# Patient Record
Sex: Female | Born: 1970 | Race: White | Hispanic: No | Marital: Single | Smoking: Current every day smoker
Health system: Southern US, Community
[De-identification: ages and names within clinical notes are randomized; demographics above are authoritative.]

## PROBLEM LIST (undated history)

## (undated) DIAGNOSIS — I10 Essential (primary) hypertension: Secondary | ICD-10-CM

## (undated) DIAGNOSIS — K219 Gastro-esophageal reflux disease without esophagitis: Secondary | ICD-10-CM

## (undated) DIAGNOSIS — G4700 Insomnia, unspecified: Secondary | ICD-10-CM

## (undated) DIAGNOSIS — J15211 Pneumonia due to Methicillin susceptible Staphylococcus aureus: Secondary | ICD-10-CM

## (undated) DIAGNOSIS — F329 Major depressive disorder, single episode, unspecified: Secondary | ICD-10-CM

## (undated) DIAGNOSIS — R918 Other nonspecific abnormal finding of lung field: Secondary | ICD-10-CM

## (undated) DIAGNOSIS — J852 Abscess of lung without pneumonia: Secondary | ICD-10-CM

## (undated) DIAGNOSIS — J449 Chronic obstructive pulmonary disease, unspecified: Secondary | ICD-10-CM

## (undated) DIAGNOSIS — K8020 Calculus of gallbladder without cholecystitis without obstruction: Secondary | ICD-10-CM

## (undated) DIAGNOSIS — I82409 Acute embolism and thrombosis of unspecified deep veins of unspecified lower extremity: Secondary | ICD-10-CM

## (undated) HISTORY — DX: Other nonspecific abnormal finding of lung field: R91.8

## (undated) HISTORY — DX: Essential (primary) hypertension: I10

## (undated) HISTORY — DX: Insomnia, unspecified: G47.00

## (undated) HISTORY — DX: Gastro-esophageal reflux disease without esophagitis: K21.9

## (undated) HISTORY — DX: Chronic obstructive pulmonary disease, unspecified: J44.9

## (undated) HISTORY — DX: Abscess of lung without pneumonia: J85.2

## (undated) HISTORY — DX: Calculus of gallbladder without cholecystitis without obstruction: K80.20

## (undated) HISTORY — DX: Acute embolism and thrombosis of unspecified deep veins of unspecified lower extremity: I82.409

## (undated) HISTORY — DX: Pneumonia due to methicillin susceptible Staphylococcus aureus: J15.211

---
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
## 2002-06-30 ENCOUNTER — Inpatient Hospital Stay (HOSPITAL_COMMUNITY): Admission: AD | Admit: 2002-06-30 | Discharge: 2002-07-07 | Payer: Self-pay | Admitting: Obstetrics and Gynecology

## 2002-07-01 ENCOUNTER — Encounter: Payer: Self-pay | Admitting: Obstetrics and Gynecology

## 2003-08-03 ENCOUNTER — Inpatient Hospital Stay (HOSPITAL_COMMUNITY): Admission: EM | Admit: 2003-08-03 | Discharge: 2003-08-07 | Payer: Self-pay | Admitting: Emergency Medicine

## 2003-09-18 ENCOUNTER — Inpatient Hospital Stay (HOSPITAL_COMMUNITY): Admission: EM | Admit: 2003-09-18 | Discharge: 2003-09-22 | Payer: Self-pay | Admitting: Emergency Medicine

## 2003-12-21 ENCOUNTER — Inpatient Hospital Stay (HOSPITAL_COMMUNITY): Admission: RE | Admit: 2003-12-21 | Discharge: 2003-12-23 | Payer: Self-pay | Admitting: Obstetrics and Gynecology

## 2004-01-02 ENCOUNTER — Ambulatory Visit (HOSPITAL_COMMUNITY): Admission: RE | Admit: 2004-01-02 | Discharge: 2004-01-02 | Payer: Self-pay | Admitting: Obstetrics and Gynecology

## 2006-03-02 ENCOUNTER — Emergency Department (HOSPITAL_COMMUNITY): Admission: EM | Admit: 2006-03-02 | Discharge: 2006-03-02 | Payer: Self-pay | Admitting: Emergency Medicine

## 2006-12-23 ENCOUNTER — Inpatient Hospital Stay (HOSPITAL_COMMUNITY): Admission: EM | Admit: 2006-12-23 | Discharge: 2006-12-26 | Payer: Self-pay | Admitting: Emergency Medicine

## 2007-04-09 ENCOUNTER — Emergency Department (HOSPITAL_COMMUNITY): Admission: EM | Admit: 2007-04-09 | Discharge: 2007-04-09 | Payer: Self-pay | Admitting: Emergency Medicine

## 2007-04-19 DIAGNOSIS — J852 Abscess of lung without pneumonia: Secondary | ICD-10-CM

## 2007-04-19 HISTORY — PX: OTHER SURGICAL HISTORY: SHX169

## 2007-04-19 HISTORY — DX: Abscess of lung without pneumonia: J85.2

## 2007-12-12 ENCOUNTER — Inpatient Hospital Stay (HOSPITAL_COMMUNITY): Admission: EM | Admit: 2007-12-12 | Discharge: 2007-12-14 | Payer: Self-pay | Admitting: Emergency Medicine

## 2008-01-06 ENCOUNTER — Inpatient Hospital Stay (HOSPITAL_COMMUNITY): Admission: EM | Admit: 2008-01-06 | Discharge: 2008-01-09 | Payer: Self-pay | Admitting: Emergency Medicine

## 2008-01-11 ENCOUNTER — Ambulatory Visit: Payer: Self-pay | Admitting: Internal Medicine

## 2008-01-11 ENCOUNTER — Inpatient Hospital Stay (HOSPITAL_COMMUNITY): Admission: EM | Admit: 2008-01-11 | Discharge: 2008-01-28 | Payer: Self-pay | Admitting: Emergency Medicine

## 2008-01-11 ENCOUNTER — Ambulatory Visit: Payer: Self-pay | Admitting: Critical Care Medicine

## 2008-01-12 ENCOUNTER — Ambulatory Visit: Payer: Self-pay | Admitting: Internal Medicine

## 2008-01-14 ENCOUNTER — Encounter: Payer: Self-pay | Admitting: Infectious Diseases

## 2008-01-15 ENCOUNTER — Encounter: Payer: Self-pay | Admitting: Pulmonary Disease

## 2008-02-04 ENCOUNTER — Encounter: Payer: Self-pay | Admitting: Vascular Surgery

## 2008-02-04 ENCOUNTER — Encounter: Payer: Self-pay | Admitting: Infectious Diseases

## 2008-02-05 ENCOUNTER — Encounter: Admission: RE | Admit: 2008-02-05 | Discharge: 2008-02-05 | Payer: Self-pay | Admitting: Diagnostic Radiology

## 2008-02-08 ENCOUNTER — Telehealth (INDEPENDENT_AMBULATORY_CARE_PROVIDER_SITE_OTHER): Payer: Self-pay | Admitting: *Deleted

## 2008-02-11 ENCOUNTER — Ambulatory Visit: Payer: Self-pay | Admitting: Emergency Medicine

## 2008-02-11 ENCOUNTER — Encounter: Payer: Self-pay | Admitting: Infectious Diseases

## 2008-02-11 DIAGNOSIS — D649 Anemia, unspecified: Secondary | ICD-10-CM | POA: Insufficient documentation

## 2008-02-11 DIAGNOSIS — J15211 Pneumonia due to Methicillin susceptible Staphylococcus aureus: Secondary | ICD-10-CM

## 2008-02-11 DIAGNOSIS — J45909 Unspecified asthma, uncomplicated: Secondary | ICD-10-CM | POA: Insufficient documentation

## 2008-02-11 DIAGNOSIS — F17200 Nicotine dependence, unspecified, uncomplicated: Secondary | ICD-10-CM | POA: Insufficient documentation

## 2008-02-11 HISTORY — DX: Unspecified asthma, uncomplicated: J45.909

## 2008-02-11 HISTORY — DX: Pneumonia due to methicillin susceptible Staphylococcus aureus: J15.211

## 2008-02-12 ENCOUNTER — Telehealth: Payer: Self-pay | Admitting: Infectious Diseases

## 2008-02-12 ENCOUNTER — Ambulatory Visit: Payer: Self-pay | Admitting: Infectious Diseases

## 2008-02-18 ENCOUNTER — Encounter: Payer: Self-pay | Admitting: Vascular Surgery

## 2008-02-18 ENCOUNTER — Encounter: Payer: Self-pay | Admitting: Infectious Diseases

## 2008-02-20 ENCOUNTER — Telehealth (INDEPENDENT_AMBULATORY_CARE_PROVIDER_SITE_OTHER): Payer: Self-pay | Admitting: *Deleted

## 2008-02-22 ENCOUNTER — Encounter: Payer: Self-pay | Admitting: Infectious Diseases

## 2008-02-27 ENCOUNTER — Telehealth (INDEPENDENT_AMBULATORY_CARE_PROVIDER_SITE_OTHER): Payer: Self-pay | Admitting: *Deleted

## 2008-02-28 ENCOUNTER — Ambulatory Visit: Payer: Self-pay | Admitting: Infectious Diseases

## 2008-03-05 ENCOUNTER — Ambulatory Visit: Payer: Self-pay | Admitting: Emergency Medicine

## 2008-03-07 ENCOUNTER — Telehealth (INDEPENDENT_AMBULATORY_CARE_PROVIDER_SITE_OTHER): Payer: Self-pay | Admitting: *Deleted

## 2008-03-10 ENCOUNTER — Ambulatory Visit: Payer: Self-pay | Admitting: Emergency Medicine

## 2008-05-20 ENCOUNTER — Emergency Department (HOSPITAL_COMMUNITY): Admission: EM | Admit: 2008-05-20 | Discharge: 2008-05-20 | Payer: Self-pay | Admitting: Emergency Medicine

## 2008-06-05 ENCOUNTER — Ambulatory Visit: Payer: Self-pay | Admitting: Emergency Medicine

## 2008-06-10 ENCOUNTER — Ambulatory Visit: Payer: Self-pay | Admitting: Emergency Medicine

## 2008-06-10 ENCOUNTER — Telehealth: Payer: Self-pay | Admitting: Emergency Medicine

## 2008-06-10 ENCOUNTER — Ambulatory Visit: Payer: Self-pay | Admitting: Cardiovascular Disease

## 2008-06-17 ENCOUNTER — Telehealth (INDEPENDENT_AMBULATORY_CARE_PROVIDER_SITE_OTHER): Payer: Self-pay | Admitting: *Deleted

## 2008-06-17 ENCOUNTER — Ambulatory Visit: Payer: Self-pay | Admitting: Emergency Medicine

## 2008-06-17 DIAGNOSIS — J449 Chronic obstructive pulmonary disease, unspecified: Secondary | ICD-10-CM | POA: Insufficient documentation

## 2008-06-18 ENCOUNTER — Telehealth: Payer: Self-pay | Admitting: Emergency Medicine

## 2008-07-01 ENCOUNTER — Telehealth: Payer: Self-pay | Admitting: Emergency Medicine

## 2008-07-22 ENCOUNTER — Ambulatory Visit: Payer: Self-pay | Admitting: Emergency Medicine

## 2008-07-22 DIAGNOSIS — J309 Allergic rhinitis, unspecified: Secondary | ICD-10-CM | POA: Insufficient documentation

## 2008-07-31 ENCOUNTER — Emergency Department (HOSPITAL_COMMUNITY): Admission: EM | Admit: 2008-07-31 | Discharge: 2008-07-31 | Payer: Self-pay | Admitting: Emergency Medicine

## 2008-08-01 ENCOUNTER — Ambulatory Visit: Payer: Self-pay | Admitting: Emergency Medicine

## 2008-08-01 ENCOUNTER — Telehealth: Payer: Self-pay | Admitting: Emergency Medicine

## 2008-08-01 DIAGNOSIS — J209 Acute bronchitis, unspecified: Secondary | ICD-10-CM | POA: Insufficient documentation

## 2008-09-08 ENCOUNTER — Ambulatory Visit: Payer: Self-pay | Admitting: Emergency Medicine

## 2008-09-10 ENCOUNTER — Telehealth (INDEPENDENT_AMBULATORY_CARE_PROVIDER_SITE_OTHER): Payer: Self-pay | Admitting: *Deleted

## 2008-09-10 ENCOUNTER — Emergency Department (HOSPITAL_COMMUNITY): Admission: EM | Admit: 2008-09-10 | Discharge: 2008-09-10 | Payer: Self-pay | Admitting: Emergency Medicine

## 2008-09-12 ENCOUNTER — Telehealth (INDEPENDENT_AMBULATORY_CARE_PROVIDER_SITE_OTHER): Payer: Self-pay | Admitting: *Deleted

## 2008-09-18 ENCOUNTER — Telehealth: Payer: Self-pay | Admitting: Emergency Medicine

## 2008-10-13 ENCOUNTER — Ambulatory Visit: Payer: Self-pay | Admitting: Emergency Medicine

## 2008-10-21 ENCOUNTER — Encounter: Payer: Self-pay | Admitting: Emergency Medicine

## 2008-12-12 ENCOUNTER — Emergency Department (HOSPITAL_COMMUNITY): Admission: EM | Admit: 2008-12-12 | Discharge: 2008-12-12 | Payer: Self-pay | Admitting: Emergency Medicine

## 2008-12-12 ENCOUNTER — Inpatient Hospital Stay (HOSPITAL_COMMUNITY): Admission: RE | Admit: 2008-12-12 | Discharge: 2008-12-15 | Payer: Self-pay | Admitting: Emergency Medicine

## 2008-12-15 ENCOUNTER — Telehealth: Payer: Self-pay | Admitting: Emergency Medicine

## 2008-12-18 ENCOUNTER — Telehealth: Payer: Self-pay | Admitting: Emergency Medicine

## 2008-12-18 DIAGNOSIS — I82409 Acute embolism and thrombosis of unspecified deep veins of unspecified lower extremity: Secondary | ICD-10-CM | POA: Insufficient documentation

## 2008-12-18 HISTORY — DX: Acute embolism and thrombosis of unspecified deep veins of unspecified lower extremity: I82.409

## 2008-12-19 ENCOUNTER — Telehealth: Payer: Self-pay | Admitting: Emergency Medicine

## 2008-12-19 ENCOUNTER — Encounter: Payer: Self-pay | Admitting: Internal Medicine

## 2008-12-23 ENCOUNTER — Encounter: Payer: Self-pay | Admitting: Cardiology

## 2008-12-23 LAB — CONVERTED CEMR LAB: POC INR: 2.2

## 2008-12-24 ENCOUNTER — Ambulatory Visit: Payer: Self-pay | Admitting: Cardiology

## 2008-12-24 LAB — CONVERTED CEMR LAB: POC INR: 2.1

## 2008-12-25 ENCOUNTER — Encounter: Payer: Self-pay | Admitting: Emergency Medicine

## 2008-12-31 ENCOUNTER — Ambulatory Visit: Payer: Self-pay | Admitting: Internal Medicine

## 2008-12-31 LAB — CONVERTED CEMR LAB: POC INR: 2.8

## 2009-01-06 ENCOUNTER — Ambulatory Visit: Payer: Self-pay | Admitting: Emergency Medicine

## 2009-01-10 ENCOUNTER — Emergency Department (HOSPITAL_COMMUNITY): Admission: EM | Admit: 2009-01-10 | Discharge: 2009-01-10 | Payer: Self-pay | Admitting: Emergency Medicine

## 2009-01-12 ENCOUNTER — Telehealth (INDEPENDENT_AMBULATORY_CARE_PROVIDER_SITE_OTHER): Payer: Self-pay | Admitting: *Deleted

## 2009-01-14 ENCOUNTER — Ambulatory Visit: Payer: Self-pay | Admitting: Cardiovascular Disease

## 2009-01-14 LAB — CONVERTED CEMR LAB: POC INR: 2.9

## 2009-01-19 ENCOUNTER — Ambulatory Visit: Payer: Self-pay | Admitting: Cardiology

## 2009-01-19 LAB — CONVERTED CEMR LAB: POC INR: 3.3

## 2009-01-22 ENCOUNTER — Encounter: Payer: Self-pay | Admitting: Emergency Medicine

## 2009-01-28 ENCOUNTER — Ambulatory Visit: Payer: Self-pay | Admitting: Cardiology

## 2009-01-28 LAB — CONVERTED CEMR LAB: POC INR: 2

## 2009-02-11 ENCOUNTER — Ambulatory Visit: Payer: Self-pay | Admitting: Cardiology

## 2009-02-12 ENCOUNTER — Ambulatory Visit: Payer: Self-pay | Admitting: Emergency Medicine

## 2009-02-18 ENCOUNTER — Telehealth (INDEPENDENT_AMBULATORY_CARE_PROVIDER_SITE_OTHER): Payer: Self-pay | Admitting: *Deleted

## 2009-02-18 ENCOUNTER — Ambulatory Visit: Payer: Self-pay | Admitting: Cardiology

## 2009-02-19 ENCOUNTER — Telehealth (INDEPENDENT_AMBULATORY_CARE_PROVIDER_SITE_OTHER): Payer: Self-pay | Admitting: *Deleted

## 2009-02-19 ENCOUNTER — Emergency Department (HOSPITAL_COMMUNITY): Admission: EM | Admit: 2009-02-19 | Discharge: 2009-02-19 | Payer: Self-pay | Admitting: Emergency Medicine

## 2009-02-23 ENCOUNTER — Telehealth: Payer: Self-pay | Admitting: Emergency Medicine

## 2009-02-26 ENCOUNTER — Ambulatory Visit: Payer: Self-pay | Admitting: Cardiology

## 2009-03-04 ENCOUNTER — Ambulatory Visit: Payer: Self-pay | Admitting: Cardiology

## 2009-03-16 ENCOUNTER — Ambulatory Visit: Payer: Self-pay | Admitting: Emergency Medicine

## 2009-03-25 ENCOUNTER — Ambulatory Visit: Payer: Self-pay | Admitting: Cardiovascular Disease

## 2009-04-08 ENCOUNTER — Ambulatory Visit: Payer: Self-pay | Admitting: Cardiology

## 2009-04-08 LAB — CONVERTED CEMR LAB: POC INR: 2.7

## 2009-04-30 ENCOUNTER — Ambulatory Visit: Payer: Self-pay | Admitting: Cardiovascular Disease

## 2009-04-30 LAB — CONVERTED CEMR LAB: POC INR: 1.9

## 2009-05-21 ENCOUNTER — Telehealth (INDEPENDENT_AMBULATORY_CARE_PROVIDER_SITE_OTHER): Payer: Self-pay | Admitting: *Deleted

## 2009-05-25 ENCOUNTER — Telehealth: Payer: Self-pay | Admitting: Emergency Medicine

## 2009-05-27 ENCOUNTER — Telehealth: Payer: Self-pay | Admitting: Emergency Medicine

## 2009-05-28 ENCOUNTER — Ambulatory Visit: Payer: Self-pay | Admitting: Cardiology

## 2009-05-28 LAB — CONVERTED CEMR LAB: POC INR: 2.9

## 2009-05-29 ENCOUNTER — Telehealth (INDEPENDENT_AMBULATORY_CARE_PROVIDER_SITE_OTHER): Payer: Self-pay | Admitting: *Deleted

## 2009-05-29 ENCOUNTER — Ambulatory Visit: Payer: Self-pay | Admitting: Emergency Medicine

## 2009-05-30 ENCOUNTER — Inpatient Hospital Stay (HOSPITAL_COMMUNITY)
Admission: EM | Admit: 2009-05-30 | Discharge: 2009-05-31 | Disposition: A | Discharge status: Home / Self Care | Payer: Self-pay | Admitting: Emergency Medicine

## 2009-06-01 ENCOUNTER — Ambulatory Visit: Payer: Self-pay | Admitting: Cardiology

## 2009-06-01 LAB — CONVERTED CEMR LAB: POC INR: 2.8

## 2009-06-04 ENCOUNTER — Ambulatory Visit: Payer: Self-pay | Admitting: Cardiology

## 2009-06-05 ENCOUNTER — Ambulatory Visit: Payer: Self-pay | Admitting: Emergency Medicine

## 2009-06-05 ENCOUNTER — Encounter: Payer: Self-pay | Admitting: Cardiology

## 2009-07-07 ENCOUNTER — Ambulatory Visit: Payer: Self-pay | Admitting: Emergency Medicine

## 2009-07-07 DIAGNOSIS — R002 Palpitations: Secondary | ICD-10-CM | POA: Insufficient documentation

## 2009-07-07 LAB — CONVERTED CEMR LAB
Protein C Activity: 156 % — ABNORMAL HIGH (ref 75–133)
Protein S Activity: 113 % (ref 69–129)

## 2009-07-24 ENCOUNTER — Encounter (INDEPENDENT_AMBULATORY_CARE_PROVIDER_SITE_OTHER): Payer: Self-pay | Admitting: *Deleted

## 2009-09-23 ENCOUNTER — Encounter (INDEPENDENT_AMBULATORY_CARE_PROVIDER_SITE_OTHER): Payer: Self-pay | Admitting: *Deleted

## 2009-10-05 ENCOUNTER — Ambulatory Visit: Payer: Self-pay | Admitting: Emergency Medicine

## 2009-11-09 ENCOUNTER — Telehealth (INDEPENDENT_AMBULATORY_CARE_PROVIDER_SITE_OTHER): Payer: Self-pay | Admitting: *Deleted

## 2009-11-23 ENCOUNTER — Encounter: Payer: Self-pay | Admitting: Emergency Medicine

## 2009-12-28 ENCOUNTER — Telehealth (INDEPENDENT_AMBULATORY_CARE_PROVIDER_SITE_OTHER): Payer: Self-pay | Admitting: *Deleted

## 2010-01-27 ENCOUNTER — Telehealth: Payer: Self-pay | Admitting: Emergency Medicine

## 2010-02-08 ENCOUNTER — Telehealth: Payer: Self-pay | Admitting: Emergency Medicine

## 2010-03-19 ENCOUNTER — Ambulatory Visit: Payer: Self-pay | Admitting: Emergency Medicine

## 2010-05-06 ENCOUNTER — Telehealth (INDEPENDENT_AMBULATORY_CARE_PROVIDER_SITE_OTHER): Payer: Self-pay | Admitting: *Deleted

## 2010-05-09 ENCOUNTER — Encounter: Payer: Self-pay | Admitting: Pulmonary Disease

## 2010-05-09 ENCOUNTER — Encounter: Payer: Self-pay | Admitting: Emergency Medicine

## 2010-05-12 ENCOUNTER — Telehealth (INDEPENDENT_AMBULATORY_CARE_PROVIDER_SITE_OTHER): Payer: Self-pay | Admitting: *Deleted

## 2010-05-18 NOTE — Medication Information (Signed)
Summary: Coumadin Clinic  Anticoagulant Therapy  Managed by: Inactive Referring MD: Levy Pupa MD PCP: Merwyn Katos MD Supervising MD: Diona Browner MD,Samuel Indication 1: DVT Lab Used: [facility] [place] Site: [place] INR RANGE 2.0-3.0          Comments: Received call from Dr Kavin Leech office today that he is taking pt off coumadin.  She is greater than 42 months post DVT.  Allergies: 1)  ! Penicillin 2)  ! Ceclor  Anticoagulation Management History:      Negative risk factors for bleeding include an age less than 42 years old.  The bleeding index is 'low risk'.  Negative CHADS2 values include Age > 42 years old.  Anticoagulation responsible provider: Diona Browner MD,Samuel.  Exp: 02/2010.    Anticoagulation Management Assessment/Plan:      The patient's current anticoagulation dose is Coumadin 10 mg tabs: take one tablet daily as directed by coumadin clinic.  The target INR is 2.0-3.0.  The next INR is due 06/10/2009.  Anticoagulation instructions were given to patient.  Results were reviewed/authorized by Inactive.         Prior Anticoagulation Instructions: INR 5.1 Hold coumadin tonight and tomorrow night, take 1/2 tablet on Saturday and Sunday then resume 1 tablet once daily except 1 1/2 tablets on Mondays, Wednesdays and Fridays

## 2010-05-18 NOTE — Assessment & Plan Note (Signed)
Summary: COPD, hx DVT, palpitations   Visit Type:  Follow-up Primary Provider/Referring Provider:  Health Department  CC:  1 month followup COPD.  Pt still c/o prod cough with yellow/brown sputum.  She states that her breathing is the same- no better or worse.  She is still smoking about 1/2 ppd.  Has not determined a quit date yet.[name]  History of Present Illness: 40 yo smoker who was admitted to [facility] 9/25 - 10/12 for bilateral MSSA cavitary PNA's c/b L empyema, VDRF. She underwent L CT placement and then a second anterior pigtail to drain residual hydropneumothorax. She went home on IV Ancef and finished 6 weeks of therapy. Pigtail was d/c'd on 02/05/08, had CT chest that same day that showed resolution of L hydroPTX, some inferior residual effusion, bilateral parenchymal disease, better than during hospitalization (10/6). Dx with LE DVT in   ROV 03/16/09 -- Since last visit, she has stopped cigarettes! Stopped on Nov 5. Was taking wellbutrin, then stopped it and started on chantix (? dose) two times a day. Also since last visit, started on Symbicort 80 two times a day. She feels better altogether since last visit, more energy.   ROV 05/29/09 -- Acute visit. I treated her for an apparent exacerbation with Pred and doxy by phone 2/7. Here today to report chest tightness, fatigue, poor by mouth intake, fevers, some chills. Coughing greenish phlegm. Off chantix. Restarted cigarettes 3 weeks ago, 5cig a day. Doesn't feel the Pred has helped her, if anything her breathing is tighter. On 40mg  by mouth once daily currently  ROV 07/07/09 -- Returns for f/u. Off prednisone. Tells me that she is about the same. Her breathing is still limited with exertion. She has noticed some HA's. Also having some periods of heart fluttering associated with some lightheadedness. She doesn't necessarily relate these to her bronchodilator. Wheezing and cough are still problems - unchanged. Still smoking 1/2 pk a day.   Current  Medications (verified): 1)  Ventolin Hfa 108 (90 Base) Mcg/act Aers (Albuterol Sulfate) .... Inhale 2 Puffs Every 4 Hours As Needed 2)  Spiriva Handihaler 18 Mcg Caps (Tiotropium Bromide Monohydrate) .[name].. 1 Inhaled Once Daily 3)  Albuterol Sulfate (2.5 Mg/53ml) 0.083%  [place] (Albuterol Sulfate) .[name].. 1 Vial Via Hhn Every 4-6hrs As Needed For Shortness of Breath 4)  Symbicort 160-4.5 Mcg/act Aero (Budesonide-Formoterol Fumarate) .... 2 Puffs Two Times A Day  Allergies (verified): 1)  ! Penicillin 2)  ! 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  mildly anxious appearing Head:  normocephalic and atraumatic Eyes:  conjunctiva and sclera clear Nose:  clear drainage Mouth:  post pharynx with erythema Neck:  supple.   Chest Wall:  well-healed CT and pigtail cath sites Lungs:  distant but no wheze (improved) Heart:  normal rate, regular rhythm, and no murmur.   Abdomen:  not examined Msk:  no deformity or scoliosis noted with normal posture Extremities:  no  clubbing, cyanosis, edema, or deformity noted Neurologic:  non-focal Skin:  no rashes.   Psych:  alert and cooperative; normal mood and affect; normal attention span and concentration   Impression & Recommendations:  Problem # 1:  DVT (ICD-453.40) - hypercoag w/u; she has been off coumadin for a month Orders: T- * Misc. Laboratory test [phone]) T- * Misc. Laboratory test [phone]) Est. Patient Level IV (29562)  Problem # 2:  COPD (ICD-496) - same BDs - discussed tobacco cessation.  Problem # 3:  PALPITATIONS  (ICD-785.1)  I'm not able to correlate these with her bronchodilator use. ? whether she needs event monitor.  - will refer to cardiology in [place] to consider w/u  Orders: Est. Patient Level IV (13086) Cardiology Referral (Cardiology)  Patient Instructions: 1)  We will check your blood work today to look for increased risk for blood clots. You will review these results with Dr Delton Coombes next visit 2)  Continue your inhaled medications as you are taking them.  3)  We will refer you to Cardiology in [place] to evaluate your palpitations.  4)  Follow up with Dr Delton Coombes in 3 months or as needed  5)  Call our office if your are ready to set a quit date for your cigarettes.

## 2010-05-18 NOTE — Assessment & Plan Note (Signed)
Summary: COPD   Visit Type:  Follow-up Primary Provider/Referring Provider:  Health Department  CC:  3 mo DVT and COPD follow-up.  The patient c/o increased sneezing x2-3 days and cough with clear mucus. She has not been using the Spiriva or Symbicort or nebulizer x1 and 1/2 months. She is only using the Ventolin.Marland Kitchen  History of Present Illness: 40 yo smoker who was admitted to [facility] 9/25 - 10/12 for bilateral MSSA cavitary PNA's c/b L empyema, VDRF. She underwent L CT placement and then a second anterior pigtail to drain residual hydropneumothorax. She went home on IV Ancef and finished 6 weeks of therapy. Pigtail was d/c'd on 02/05/08, had CT chest that same day that showed resolution of L hydroPTX, some inferior residual effusion, bilateral parenchymal disease, better than during hospitalization (10/6). Dx with LE DVT, now off anticoag  ROV 03/16/09 -- Since last visit, she has stopped cigarettes! Stopped on Nov 5. Was taking wellbutrin, then stopped it and started on chantix (? dose) two times a day. Also since last visit, started on Symbicort 80 two times a day. She feels better altogether since last visit, more energy.   ROV 05/29/09 -- Acute visit. I treated her for an apparent exacerbation with Pred and doxy by phone 2/7. Here today to report chest tightness, fatigue, poor by mouth intake, fevers, some chills. Coughing greenish phlegm. Off chantix. Restarted cigarettes 3 weeks ago, 5cig a day. Doesn't feel the Pred has helped her, if anything her breathing is tighter. On 40mg  by mouth once daily currently  ROV 07/07/09 -- Returns for f/u. Off prednisone. Tells me that she is about the same. Her breathing is still limited with exertion. She has noticed some HA's. Also having some periods of heart fluttering associated with some lightheadedness. She doesn't necessarily relate these to her bronchodilator. Wheezing and cough are still problems - unchanged. Still smoking 1/2 pk a day.   ROV 10/05/09 --  regular f/u COPD. Since last visit has had high stress, has lost some wt. Her EtOH use and her cigarette use have both gone up, now 2pks/day. Stopped Spiriva and Symbicort 1 month ago. Her breathing worsened slightly, but she feels she has tolerated being off. Her Ventolin use has increased some. No exacerbations since last visit.   Preventive Screening-Counseling & Management  Alcohol-Tobacco     Smoking Cessation Counseling: yes     Packs/Day: 2.0  Current Medications (verified): 1)  Ventolin Hfa 108 (90 Base) Mcg/act Aers (Albuterol Sulfate) .... Inhale 2 Puffs Every 4 Hours As Needed  Allergies (verified): 1)  ! Penicillin 2)  ! Ceclor  Social History: Patient states former smoker. Smoking x 61yrs upto 2ppd. Pt is seperated with 6 children. Just broke up with her boyfriend Pt is homemaker. Increased EtOH use to 4 -5 beers/dayPacks/Day:  2.0  Vital Signs:  Patient profile:   40 year old female Height:      64 inches (162.56 cm) Weight:      154 pounds (70 kg) BMI:     26.53 O2 Sat:      94 % on Room air Temp:     98.1 degrees F (36.72 degrees C) oral Pulse rate:   105 / minute BP sitting:   118 / 72  (right arm) Cuff size:   regular  Vitals Entered By: Michel Bickers CMA (October 05, 2009 1:34 PM)  O2 Sat at Rest %:  94 O2 Flow:  Room air CC: 3 mo DVT and COPD follow-up.  The patient c/o increased sneezing x2-3 days and cough with clear mucus. She has not been using the Spiriva or Symbicort or nebulizer x1 and 1/2 months. She is only using the Ventolin. Comments Medications reviewed. Daytime phone verified. Michel Bickers CMA  October 05, 2009 1:35 PM   Physical Exam  General:  alert and well-developed.  mildly anxious appearing, has lost some wt Head:  normocephalic and atraumatic Eyes:  conjunctiva and sclera clear Nose:  clear drainage Mouth:  post pharynx with erythema Neck:  supple.   Chest Wall:  well-healed CT and pigtail cath sites Lungs:  distant, scattered exp  wheezes Heart:  normal rate, regular rhythm, and no murmur.   Abdomen:  not examined Msk:  no deformity or scoliosis noted with normal posture Extremities:  no clubbing, cyanosis, edema, or deformity noted Neurologic:  non-focal Skin:  no rashes.   Psych:  alert and cooperative; normal mood and affect; normal attention span and concentration   Impression & Recommendations:  Problem # 1:  COPD (ICD-496) Compliance and smoking have both worsened significantly since last visit, although she denies breathing changes.   restart spiriva + ventolin hold off on restart symbicort and follow clinical status ROV 4 months  Problem # 2:  TOBACCO ABUSE (ICD-305.1) counselled cessation  Medications Added to Medication List This Visit: 1)  Spiriva Handihaler 18 Mcg Caps (Tiotropium bromide monohydrate) .Marland Kitchen.. 1 inhalation once daily  Other Orders: Prescription Created Electronically [id number]) Est. Patient Level IV (02725) Tobacco use cessation intermediate 3-10 minutes (36644)  Patient Instructions: 1)  Restart Spiriva 1 once daily  2)  Use ventolin as needed 3)  Follow up with Dr Delton Coombes in 4 months or as needed  Prescriptions: SPIRIVA HANDIHALER 18 MCG CAPS (TIOTROPIUM BROMIDE MONOHYDRATE) 1 inhalation once daily  #30 x 11   Entered and Authorized by:   Leslye Peer MD   Signed by:   Leslye Peer MD on 10/05/2009   Method used:   Electronically to        [facility] (retail)       [address]       [place], [place]  [address]       Ph: [phone]       Fax: [phone]   RxID:   [id number] VENTOLIN HFA 108 (90 BASE) MCG/ACT AERS (ALBUTEROL SULFATE) Inhale 2 puffs every 4 hours as needed  #1 x 6   Entered and Authorized by:   Leslye Peer MD   Signed by:   Leslye Peer MD on 10/05/2009   Method used:   Electronically to        [facility] (retail)       [address]       [place], [place]  [address]        Ph: [phone]       Fax: [phone]   RxID:   [id number]

## 2010-05-18 NOTE — Progress Notes (Signed)
Summary: fever and tightness in chest  Phone Note Call from Patient   Caller: Patient Call For: byrum Summary of Call: pt not any betterstill have tightness in the chest fever100.4 and no appetite was told to call back if no better. Initial call taken by: Rickard Patience,  May 29, 2009 8:23 AM  Follow-up for Phone Call        OV with RB this am at 11. Follow-up by: Vernie Murders,  May 29, 2009 9:19 AM

## 2010-05-18 NOTE — Progress Notes (Signed)
Summary: prescript fro symbicort  Phone Note Call from Patient Call back at [phone]   Caller: Patient Call For: Eithan Beagle Summary of Call: need prescript for symbicort called to pharmacy [facility] [phone] Initial call taken by: Rickard Patience,  January 27, 2010 2:38 PM  Follow-up for Phone Call        LMTCBx1. Pt has appt tomorrow?  Carron Curie CMA  January 27, 2010 3:49 PM Spoke with pt and symbicort is not on her med list. She staets that at last OV she told RB that she didnt need symbicort but she has been having increased wheezing and SOB and feels she needs to go back on Symbicort along with the Spiriva.  [place] for appt so she wants to reschedule. I advised we can reschedule but she has to see him at least once a year for refills. Pt states understanding. Please advsie if ok to send in refill.  Carron Curie CMA  January 27, 2010 4:06 PM   Additional Follow-up for Phone Call Additional follow up Details #1::        pt still have not heard whether she will be able to have symbicort. Additional Follow-up by: Rickard Patience,  January 28, 2010 9:06 AM    Additional Follow-up for Phone Call Additional follow up Details #2::    It it OK to send in a refill for Symbicort 160/4.5 2 puffs two times a day. But I do want to see her soon. Alternatively [place] Follow-up by: Leslye Peer MD,  January 28, 2010 10:21 AM  Additional Follow-up for Phone Call Additional follow up Details #3:: Details for Additional Follow-up Action Taken: refill sent. pt aware needs to keep appt. pt states she wants to continue to see RB. Carron Curie CMA  January 28, 2010 10:54 AM   New/Updated Medications: SYMBICORT 160-4.5 MCG/ACT AERO (BUDESONIDE-FORMOTEROL FUMARATE) 2 puffs twice daily Prescriptions: SYMBICORT 160-4.5 MCG/ACT AERO (BUDESONIDE-FORMOTEROL FUMARATE) 2 puffs twice  daily  #1 x 0   Entered by:   Carron Curie CMA   Authorized by:   Leslye Peer MD   Signed by:   Carron Curie CMA on 01/28/2010   Method used:   Electronically to        [facility] [address] [address] (retail)       [address]       [place], [place]  [address]       Ph: [phone]       Fax: [phone]   RxID:   [id number]

## 2010-05-18 NOTE — Progress Notes (Signed)
Summary: nos appt  Phone Note Call from Patient   Caller: [email or web address]  Call For: byrum Summary of Call: LMTCB x2 to rsc nos from 10/21. Initial call taken by: Darletta Moll,  February 08, 2010 3:06 PM

## 2010-05-18 NOTE — Medication Information (Signed)
Summary: ccr-lr  Anticoagulant Therapy  Managed by: Vashti Hey, RN Referring MD: Levy Pupa MD PCP: Merwyn Katos MD Supervising MD: Diona Browner MD, Remi Deter Indication 1: DVT Lab Used: [facility] [place] Site: [place] INR POC 2.9 INR RANGE 2.0-3.0  Dietary changes: no    Health status changes: no    Bleeding/hemorrhagic complications: no    Recent/future hospitalizations: no    Any changes in medication regimen? yes       Details: on doxycycline and prednisone  Started Tuesday 2/8  Recent/future dental: no  Any missed doses?: no       Is patient compliant with meds? yes       Allergies: 1)  ! Penicillin 2)  ! Ceclor  Anticoagulation Management History:      Negative risk factors for bleeding include an age less than 35 years old.  The bleeding index is 'low risk'.  Negative CHADS2 values include Age > 47 years old.  Anticoagulation responsible provider: Diona Browner MD, Remi Deter.  INR POC: 2.9.  Exp: 02/2010.    Anticoagulation Management Assessment/Plan:      The patient's current anticoagulation dose is Coumadin 10 mg tabs: take one tablet daily as directed by coumadin clinic.  The target INR is 2.0-3.0.  The next INR is due 06/01/2009.  Anticoagulation instructions were given to patient.  Results were reviewed/authorized by Vashti Hey, RN.  She was notified by Vashti Hey RN.         Prior Anticoagulation Instructions: INR 1.9 Take coumadin 2 tablets tonight then resume 1 tablet once daily except 1 1/2 tablets on Mondays, Wednesdays and Fridays  Current Anticoagulation Instructions: INR 2.9 Continue coumadin 10mg  once daily except 15mg  on Mondays, Wednesdays and Fridays

## 2010-05-18 NOTE — Assessment & Plan Note (Signed)
Summary: COPD, tobacco   Visit Type:  Follow-up Primary Provider/Referring Provider:  Health Department  CC:  4 month followup COPD.  She is c/o left side rib pain x 2 wks- pain radiates to her back and is worse with deep inspiration.  She states that her SOB has been worse over the past 3-4 months.  She also c/o increased cough- prod with yellow sputum x 1 wk.  Still smoking 1 ppd..  History of Present Illness: 39 yo smoker who was admitted to [facility] 9/25 - 10/12 for bilateral MSSA cavitary PNA's c/b L empyema, VDRF. She underwent L CT placement and then a second anterior pigtail to drain residual hydropneumothorax. She went home on IV Ancef and finished 6 weeks of therapy. Pigtail was d/c'd on 02/05/08. Followed now for COPD, tobacco use.   ROV 05/29/09 -- Acute visit. I treated her for an apparent exacerbation with Pred and doxy by phone 2/7. Here today to report chest tightness, fatigue, poor by mouth intake, fevers, some chills. Coughing greenish phlegm. Off chantix. Restarted cigarettes 3 weeks ago, 5cig a day. Doesn't feel the Pred has helped her, if anything her breathing is tighter. On 40mg  by mouth once daily currently  ROV 07/07/09 -- Returns for f/u. Off prednisone. Tells me that she is about the same. Her breathing is still limited with exertion. She has noticed some HA's. Also having some periods of heart fluttering associated with some lightheadedness. She doesn't necessarily relate these to her bronchodilator. Wheezing and cough are still problems - unchanged. Still smoking 1/2 pk a day.   ROV 10/05/09 -- regular f/u COPD. Since last visit has had high stress, has lost some wt. Her EtOH use and her cigarette use have both gone up, now 2pks/day. Stopped Spiriva and Symbicort 1 month ago. Her breathing worsened slightly, but she feels she has tolerated being off. Her Ventolin use has increased some. No exacerbations since last visit.   ROV 03/19/10 -- COPD, tobacco use. Still smoking 1  pk/day. Having SOB with both exertion and sometimes at rest. No fevers, coughs frequently, yellow mucous, no hemoptysis. Describes a pain in L chest below L breast, has moved to flank and L back, notices with deep breath.   Preventive Screening-Counseling & Management  Alcohol-Tobacco     Smoking Cessation Counseling: yes  Current Medications (verified): 1)  Ventolin Hfa 108 (90 Base) Mcg/act Aers (Albuterol Sulfate) .... Inhale 2 Puffs Every 4 Hours As Needed 2)  Spiriva Handihaler 18 Mcg Caps (Tiotropium Bromide Monohydrate) .Marland Kitchen.. 1 Inhalation Once Daily 3)  Symbicort 160-4.5 Mcg/act Aero (Budesonide-Formoterol Fumarate) .... 2 Puffs Twice Daily  Allergies (verified): 1)  ! Penicillin 2)  ! 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 c-section x 2 last 2005  Family History: Reviewed history from 02/11/2008 and no changes required. mother-heart disease father-colon CA, throat CA  Social History: Patient states former smoker. Smoking x 22yrs upto 2ppd. Pt is separated with 6 children. Pt is homemaker, in process of getting disability Increased EtOH use to 4 -5 beers/day  Vital Signs:  Patient profile:   40 year old female Weight:      141.50 pounds O2 Sat:      95 % on Room air Temp:     98.0  degrees F oral Pulse rate:   100 / minute BP sitting:   100 / 70  (left arm)  Vitals Entered By: Vernie Murders (March 19, 2010 2:46 PM)  O2 Flow:  Room air  Physical Exam  General:  alert and well-developed.  mildly anxious appearing, has lost some wt Head:  normocephalic and atraumatic Eyes:  conjunctiva and sclera clear Nose:  no drainage Mouth:  post pharynx with erythema Neck:  supple.     Chest Wall:  well-healed CT and pigtail cath sites Lungs:  B basilar low-pitched wheezes Heart:  normal rate, regular rhythm, and no murmur.   Abdomen:  not examined Msk:  no deformity or scoliosis noted with normal posture Extremities:  no clubbing, cyanosis, edema, or deformity noted Neurologic:  non-focal Skin:  no rashes.   Psych:  alert and cooperative; normal mood and affect; normal attention span and concentration   Impression & Recommendations:  Problem # 1:  COPD (ICD-496) Moderate to severe by PFT and her functional assessment. I have underscored with her that there is not much for me to add to her regimen - the only next thing to do is stop smoking.  - Spiriva + Symbicort - as needed albuterol - follow up 4 months or as needed  - call us when she wants help w smoking.  - flu shot today  Problem # 2:  TOBACCO ABUSE (ICD-305.1)  Discussed reasons to stop in detail. She wants to quit, is hoping to do so at [facility]. I offered her support with the process  Orders: Est. Patient Level IV (24401)  Patient Instructions: 1)  Continue your Spiriva and Symbicort as you are taking them 2)  Use albuterol as needed  3)  Flu shot today 4)  We discussed smoking in detail today. You MUST quit if you want your breathing to stabilize. Your disease will continue to worsen quickly as long as you smoke.  5)  Follow up with Dr Delton Coombes in 6 months or as needed

## 2010-05-18 NOTE — Medication Information (Signed)
Summary: ccr-lr  Anticoagulant Therapy  Managed by: Vashti Hey, RN Referring MD: Levy Pupa MD PCP: Merwyn Katos MD Supervising MD: Daleen Squibb MD, Maisie Fus Indication 1: DVT Lab Used: [facility] [place] Site: [place] INR POC 5.1 INR RANGE 2.0-3.0  Dietary changes: no    Health status changes: no    Bleeding/hemorrhagic complications: no    Recent/future hospitalizations: no    Any changes in medication regimen? yes       Details: Has 2 days abx left and 4 days of prednisone left  Recent/future dental: no  Any missed doses?: no       Is patient compliant with meds? yes       Allergies: 1)  ! Penicillin 2)  ! Ceclor  Anticoagulation Management History:      The patient is taking warfarin and comes in today for a routine follow up visit.  Negative risk factors for bleeding include an age less than 63 years old.  The bleeding index is 'low risk'.  Negative CHADS2 values include Age > 76 years old.  Anticoagulation responsible provider: Daleen Squibb MD, Maisie Fus.  INR POC: 5.1.  Cuvette Lot#: [id number].  Exp: 02/2010.    Anticoagulation Management Assessment/Plan:      The patient's current anticoagulation dose is Coumadin 10 mg tabs: take one tablet daily as directed by coumadin clinic.  The target INR is 2.0-3.0.  The next INR is due 06/10/2009.  Anticoagulation instructions were given to patient.  Results were reviewed/authorized by Vashti Hey, RN.  She was notified by Vashti Hey RN.         Prior Anticoagulation Instructions: INR 2.8 Continue coumadin 10mg  once daily except 15mg  on mondays, Wednesdays and Fridays  Current Anticoagulation Instructions: INR 5.1 Hold coumadin tonight and tomorrow night, take 1/2 tablet on Saturday and Sunday then resume 1 tablet once daily except 1 1/2 tablets on Mondays, Wednesdays and Fridays

## 2010-05-18 NOTE — Medication Information (Signed)
Summary: ccr-lr  Anticoagulant Therapy  Managed by: Vashti Hey, RN Referring MD: Levy Pupa MD PCP: Merwyn Katos MD Supervising MD: Eden Emms MD, Theron Arista Indication 1: DVT Lab Used: [facility] [place] Site: [place] INR POC 1.9 INR RANGE 2.0-3.0  Dietary changes: no    Health status changes: no    Bleeding/hemorrhagic complications: no    Recent/future hospitalizations: no    Any changes in medication regimen? no    Recent/future dental: no  Any missed doses?: no       Is patient compliant with meds? yes       Allergies: 1)  ! Penicillin 2)  ! Ceclor  Anticoagulation Management History:      The patient is taking warfarin and comes in today for a routine follow up visit.  Negative risk factors for bleeding include an age less than 62 years old.  The bleeding index is 'low risk'.  Negative CHADS2 values include Age > 41 years old.  Anticoagulation responsible provider: Eden Emms MD, Theron Arista.  INR POC: 1.9.  Cuvette Lot#: [id number].  Exp: 02/2010.    Anticoagulation Management Assessment/Plan:      The patient's current anticoagulation dose is Coumadin 10 mg tabs: take one tablet daily as directed by coumadin clinic.  The target INR is 2.0-3.0.  The next INR is due 05/28/2009.  Anticoagulation instructions were given to patient.  Results were reviewed/authorized by Vashti Hey, RN.  She was notified by Vashti Hey RN.         Prior Anticoagulation Instructions: INR 2.7 Continue coumadin 10mg  once daily except 15mg  on Mondays, Wednesdays and Fridays  Current Anticoagulation Instructions: INR 1.9 Take coumadin 2 tablets tonight then resume 1 tablet once daily except 1 1/2 tablets on Mondays, Wednesdays and Fridays

## 2010-05-18 NOTE — Letter (Signed)
Summary: Appointment - Missed  [facility] [place]  [address], [place] [address]   Phone: [phone]  Fax: [phone]     July 24, 2009 MRN: [id number]   DYNA FIGUEREO [address] [place], [place]  [address]   Dear Ms. Penafiel,  Our records indicate you missed your appointment on    07/24/09                    with Dr.       .      NISHAN                              It is very important that we reach you to reschedule this appointment. We look forward to participating in your health care needs. Please contact us at the number listed above at your earliest convenience to reschedule this appointment.     Sincerely,    Glass blower/designer

## 2010-05-18 NOTE — Progress Notes (Signed)
Summary: need rx  Phone Note Call from Patient Call back at Home Phone [phone]   Caller: Patient Call For: byrum Reason for Call: Talk to Nurse Summary of Call: need a rx for tubing to take her neb treatment with.  Boyfriend at pharmacy now.  [facility] Initial call taken by: Eugene Gavia,  May 27, 2009 2:44 PM  Follow-up for Phone Call        called and spoke with April at [facility] and gave 1 neb kit til she sees RB on 06-05-09. Reynaldo Minium CMA  May 27, 2009 2:59 PM

## 2010-05-18 NOTE — Letter (Signed)
Summary: Appointment - Missed  [facility] [place]  [address], [place] [address]   Phone: [phone]  Fax: [phone]     September 23, 2009 MRN: [id number]   [name] [address] [place], [place]  [address]   Dear [name],  Our records indicate you missed your appointment on        09/23/09 DR MCDOWELL             It is very important that we reach you to reschedule this appointment. We look forward to participating in your health care needs. Please contact us at the number listed above at your earliest convenience to reschedule this appointment.     Sincerely,    Glass blower/designer

## 2010-05-18 NOTE — Letter (Signed)
Summary: [facility]  [address]   [place], [place] [address]   Phone: [phone]  Fax: [phone]    11/23/2009  RE: [name] [address] [facility] [address] [place]  [address]  [place]  To Whom it May Concern:  Ms [name] is a patient under my care at [facility]. She formally was diagnosed with Chronic Obstructive Pulmonary Disease (COPD) 06/10/2008 by Pulmonary Function Testing, although her symptoms began earlier. She is on daily medications for this problem. Her FEV1 falls into the Moderately Severe range, and she has been clinically limited by her disease. This is a chronic disease, and she will be dealing with its impact on her level of functioning for the rest of her life. If you have questions please contact our office at [phone].   Sincerely,   Levy Pupa MD

## 2010-05-18 NOTE — Progress Notes (Signed)
Summary: spiriva- pt is out  Phone Note Call from Patient Call back at [facility] Phone [phone]   Caller: Patient Call For: byrum Summary of Call: pt is out of spiriva as of tonight. please refill per recent request from pharmacy.  Initial call taken by: Tivis Ringer, CNA,  May 21, 2009 4:24 PM  Follow-up for Phone Call        called, spoke with pt.  Pt [facility] [address]  She verbalized understanding.  Follow-up by: Gweneth Dimitri RN,  May 21, 2009 4:38 PM

## 2010-05-18 NOTE — Progress Notes (Signed)
Summary: rx request/ cough/ congestion  Phone Note Call from Patient Call back at Home Phone [phone]   Caller: Patient Call For: [name] Summary of Call: pt c/o cough w/ brown/yellow phlegm and SOB. says she has had this since sat. also fever around 101 at night; none during the day but has chills as well. has used an OTC cough syrup but this hasn't helped stop cough. [facility] [place] [facility] [address].  Initial call taken by: Tivis Ringer, CNA,  May 25, 2009 12:05 PM  Follow-up for Phone Call        Please advise. Allergies: PCN, Ceclor. Carron Curie CMA  May 25, 2009 2:44 PM  pt called back, said someone called @ 8:30pm last night.  She would appreciate a call back today.  [phone] Follow-up by: Eugene Gavia,  May 26, 2009 8:17 AM  Additional Follow-up for Phone Call Additional follow up Details #1::        Sent orders for pred taper and doxycycline. Please notify her to let her know. If her symptoms aren't better 3days, then she needs to call us and be seen. Leslye Peer MD  May 26, 2009 9:58 AM   pt advised.Carron Curie CMA  May 26, 2009 10:06 AM     New/Updated Medications: DOXYCYCLINE HYCLATE 100 MG TABS (DOXYCYCLINE HYCLATE) 1 by mouth two times a day PREDNISONE 10 MG TABS (PREDNISONE) 40mg  daily x 3days, 30mg  daily x 3days, 20mg  daily x 3days, 10mg  daily x3 days then stop. Prescriptions: PREDNISONE 10 MG TABS (PREDNISONE) 40mg  daily x 3days, 30mg  daily x 3days, 20mg  daily x 3days, 10mg  daily x3 days then stop.  #30 x 0   Entered and Authorized by:   Leslye Peer MD   Signed by:   Leslye Peer MD on 05/26/2009   Method used:   [facility]  [address] [id number]       [address]       [place], [place]  [address]       Ph: [phone] or [phone]       Fax: [phone]   RxID:   [id number] DOXYCYCLINE HYCLATE 100 MG TABS (DOXYCYCLINE HYCLATE) 1  by mouth two times a day  #14 x 0   Entered and Authorized by:   Leslye Peer MD   Signed by:   Leslye Peer MD on 05/26/2009   Method used:   [facility]  [address] [id number]       [address]       [place], [place]  [address]       Ph: [phone] or [phone]       Fax: [phone]   RxID:   [id number]

## 2010-05-18 NOTE — Assessment & Plan Note (Signed)
Summary: COPD, DVT   Visit Type:  Follow-up Primary Provider/Referring Provider:  Merwyn Katos MD  CC:  COPD...cough with yellow mucus...pt finished avelox and is on Prednisone at 30mg  today. She was seen in ER on Saturday 05/30/09 for dehydration.  PT/INR  levels are elevated.  Patient c/o fatigue.Marland Kitchen  History of Present Illness: 40 yo smoker who was admitted to [facility] 9/25 - 10/12 for bilateral MSSA cavitary PNA's c/b L empyema, VDRF. She underwent L CT placement and then a second anterior pigtail to drain residual hydropneumothorax. She went home on IV Ancef and finished 6 weeks of therapy. Pigtail was d/c'd on 02/05/08, had CT chest that same day that showed resolution of L hydroPTX, some inferior residual effusion, bilateral parenchymal disease, better than during hospitalization (10/6).   ROV 01/06/09 -- returns for f/u COPD. Has been dx with LE DVT, started on coumadin. Tells me today that she has been having more trouble breathing for about 3 weeks. Doesnt feel that the spiriva helps as much as it used to. Has been using ventolin several times a day. Has had some wheeze and cold symptoms today. Still smoking 1 pk a day.   ROV 02/12/09 -- since last visit was seen at [facility] for an acute exacerbation.  Returns for f/u. Continues to have dyspnea  - sometimes at rest, feels like she can't take a deep breath. She is on Spiriva, takes combivent two times a day, ventolin as needed. No cough, occas wheeze - happens every day with exertion.  Continues to smoke 1 pk a day, stopped wellbutrin.   ROV 03/16/09 -- Since last visit, she has stopped cigarettes! Stopped on Nov 5. Was taking wellbutrin, then stopped it and started on chantix (? dose) two times a day. Also since last visit, started on Symbicort 80 two times a day. She feels better altogether since last visit, more energy.   ROV 05/29/09 -- Acute visit. I treated her for an apparent exacerbation with Pred and doxy by phone 2/7. Here today to  report chest tightness, fatigue, poor by mouth intake, fevers, some chills. Coughing greenish phlegm. Off chantix. Restarted cigarettes 3 weeks ago, 5cig a day. Doesn't feel the Pred has helped her, if anything her breathing is tighter. On 40mg  by mouth once daily currently  ROV 06/05/09 -- Returns for f/u. Had to be admitted to hosp in setting vomiting (? due to the avelox), treated for dehydration, found to have subtherapeutic INR. Feeling better with regard to URI, breathing not back to baseline. Smoking 1/2 pk a day. Currently on Pred 30mg  once daily and tapering.   Current Medications (verified): 1)  Ventolin Hfa 108 (90 Base) Mcg/act Aers (Albuterol Sulfate) .... Inhale 2 Puffs Every 4 Hours As Needed 2)  Spiriva Handihaler 18 Mcg Caps (Tiotropium Bromide Monohydrate) .Marland Kitchen.. 1 Inhaled Once Daily 3)  Albuterol Sulfate (2.5 Mg/62ml) 0.083%  Nebu (Albuterol Sulfate) .Marland Kitchen.. 1 Vial Via Hhn Every 4-6hrs As Needed For Shortness of Breath 4)  Coumadin 10 Mg Tabs (Warfarin Sodium) .... Take One Tablet Daily As Directed By Coumadin Clinic 5)  Symbicort 160-4.5 Mcg/act Aero (Budesonide-Formoterol Fumarate) .... 2 Puffs Two Times A Day 6)  Prednisone 10 Mg Tabs (Prednisone) .... 40mg  Daily X 3days, 30mg  Daily X 3days, 20mg  Daily X 3days, 10mg  Daily X3 Days Then Stop. 7)  Tussionex Pennkinetic Er 8-10 Mg/77ml Lqcr (Chlorpheniramine-Hydrocodone) .... 5cc By Mouth Q12h As Needed Cough  Allergies (verified): 1)  ! Penicillin 2)  ! Ceclor  Vital  Signs:  Patient profile:   40 year old female Height:      64 inches (162.56 cm) Weight:      188 pounds (85.45 kg) BMI:     32.39 O2 Sat:      95 % on Room air Temp:     97.5 degrees F (36.39 degrees C) oral Pulse rate:   111 / minute BP sitting:   116 / 72  (left arm) Cuff size:   regular  Vitals Entered By: Michel Bickers CMA (June 05, 2009 10:37 AM)  O2 Sat at Rest %:  95 O2 Flow:  Room air  Physical Exam  General:  alert and well-developed.  mildly  anxious appearing Head:  normocephalic and atraumatic Eyes:  conjunctiva and sclera clear Nose:  clear drainage Mouth:  post pharynx with erythema Neck:  supple.   Chest Wall:  well-healed CT and pigtail cath sites Lungs:  low pitched exp wheezes throughout all fields, improved compared with a week ago Heart:  normal rate, regular rhythm, and no murmur.   Abdomen:  not examined Msk:  no deformity or scoliosis noted with normal posture Extremities:  no clubbing, cyanosis, edema, or deformity noted Neurologic:  non-focal Skin:  no rashes.   Psych:  alert and cooperative; normal mood and affect; normal attention span and concentration   Impression & Recommendations:  Problem # 1:  COPD (ICD-496) remains wheezy but overall improved.  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 Patient Level IV (45409) Cardiology Referral (Cardiology)  Problem # 3:  TOBACCO ABUSE (ICD-305.1)  Discussed cessation - want her to come back with a quit date next visit  Orders: Est. Patient Level IV (81191)  Patient Instructions: 1)  Stop Avelox 2)  Stop coumadin 3)  Continue your inhaled medications 4)  We discussed smoking cessation today. At your next visit we will discuss a possible quit date. 5)  Follow up with Dr Delton Coombes in 1 month or as needed.

## 2010-05-18 NOTE — Progress Notes (Signed)
Summary: cough/ congested- needs ov-  Phone Note Call from Patient Call back at [phone]   Caller: Patient Call For: byrum Summary of Call: [facility]ing and [facility]ed [facility] [phone] Initial call taken by: Rickard Patience,  December 28, 2009 8:25 AM  Follow-up for Phone Call        Pt c/o non-productive cough, head, and chest congestion x 2 days.  Pt also c/o sneezing, runny nose, fever T-102.2, the first night  which have since stopped. Pt states is using Spiriva once daily and restart Symbicort 80-4.5, 2 puffs two times a day along with Ventolin as needed, and Albuterol neb treatments every 4 hours. Pt states breathing is not worse but not able to breathe as easy. Per RB last OV note pt was told to hold restart of Symbicort. Will forward to "doc of the day"Please advise. Thanks Zackery Barefoot CMA  December 28, 2009 9:30 AM  needs ov with tammy np asap with all meds in hand, nothing to do here over the phone, go to er if needed  Follow-up by: Nyoka Cowden MD,  December 28, 2009 1:22 PM  Additional Follow-up for Phone Call Additional follow up Details #1::        LMOMTCB Vernie Murders  December 28, 2009 1:44 PM  [facility] Gweneth Dimitri RN  December 28, 2009 4:02 PM      Additional Follow-up for Phone Call Additional follow up Details #2::    Spoke with pt.  I advised that she needs appt to be evaluated.  [place].  I then advised that she go to her nearest ER or UC to be evaluated.  Pt verbalized understanding. Follow-up by: Vernie Murders,  December 28, 2009 4:33 PM

## 2010-05-18 NOTE — Progress Notes (Signed)
Summary: disability letter  Phone Note Call from Patient Call back at [phone]   Caller: Patient Call For: Byrum Reason for Call: Talk to Nurse Summary of Call: Pt needs a letter for a Disability hearing.  Needs to state what pt's condition is and that it is long term, and how long she has had condition.  She would like to pick up Wednesday. Initial call taken by: Eugene Gavia,  November 09, 2009 9:09 AM  Follow-up for Phone Call        called spoke with patient who is requesting a letter from RB so that she may begin disability.  asked patient if she had any forms, which she denied stating that she is doing this via internet.  states there is no due date, but is requesting ASAP.  pt will be in [place] wednesday and can pick up then. Boone Master CNA/MA  November 09, 2009 10:53 AM   called spoke with renee in [employer].  she states that since pt does not have a form from her employer/insurance RB can write this letter rather than [employer].  RB is not in the office all week, will return 8.1.11 in the afternoon.  LM on pt's named VM informing her of this.  will forward to RB. Boone Master CNA/MA  November 09, 2009 11:07 AM    Additional Follow-up for Phone Call Additional follow up Details #1::        Letter redone. She can pick up. Leslye Peer MD  November 23, 2009 3:55 AM  Additional Follow-up by: Leslye Peer MD,  November 23, 2009 3:55 AM    Additional Follow-up for Phone Call Additional follow up Details #2::    Patient is aware letter is ready but would like this mailed to her new address. Address has been changed in EMR and IDX to: [address], [place], [place] [address]. Phone is [phone]. Letter will be mailed today. Follow-up by: Michel Bickers CMA,  November 23, 2009 9:04 AM

## 2010-05-18 NOTE — Medication Information (Signed)
Summary: ccr-lr  Anticoagulant Therapy  Managed by: Vashti Hey, RN Referring MD: Levy Pupa MD PCP: Merwyn Katos MD Supervising MD: Dietrich Pates MD, Molly Maduro Indication 1: DVT Lab Used: [facility] [place] [place] Site: [place] INR POC 2.8 INR RANGE 2.0-3.0  Dietary changes: no    Health status changes: no    Bleeding/hemorrhagic complications: no    Recent/future hospitalizations: yes       Details: In [facility] 2/12 - 2/13 for COPD    Any changes in medication regimen? yes       Details: Started Avelox 400mg  x 7 days   Started 05/29/09  Recent/future dental: no  Any missed doses?: no       Is patient compliant with meds? yes      Comments: In [facility] 2/12 - 2/13 for COPD   Pt was discahrged last pm.  She was sent home with Rx for Lovenox because INR was 1.5 on discharge.  Pt did not get filled because Medicaid would not pay.  She came in this am for INR check.  INR 2.8  Pt to continue coumadin per regular schedule.  She remains on Avelox and Pred taper.  Will recheck INR on 06/04/09.  Allergies: 1)  ! Penicillin 2)  ! Ceclor  Anticoagulation Management History:      The patient is taking warfarin and comes in today for a routine follow up visit.  Negative risk factors for bleeding include an age less than 42 years old.  The bleeding index is 'low risk'.  Negative CHADS2 values include Age > 42 years old.  Anticoagulation responsible provider: Dietrich Pates MD, Molly Maduro.  INR POC: 2.8.  Cuvette Lot#: [id number].  Exp: 02/2010.    Anticoagulation Management Assessment/Plan:      The patient's current anticoagulation dose is Coumadin 10 mg tabs: take one tablet daily as directed by coumadin clinic.  The target INR is 2.0-3.0.  The next INR is due 06/04/2009.  Anticoagulation instructions were given to patient.  Results were reviewed/authorized by Vashti Hey, RN.  She was notified by Vashti Hey RN.         Prior Anticoagulation Instructions: INR 2.9 Continue coumadin 10mg  once daily  except 15mg  on Mondays, Wednesdays and Fridays  Current Anticoagulation Instructions: INR 2.8 Continue coumadin 10mg  once daily except 15mg  on mondays, Wednesdays and Fridays

## 2010-05-18 NOTE — Assessment & Plan Note (Signed)
Summary: COPD exac   Visit Type:  Follow-up  CC:  Acute visit. COPD. Smoker. The patient c/o increased sob and cough with green mucus and fever off and on x1 week. She is on a Prednisone taper at 40mg  today and on Doxycycline.Marland Kitchen  History of Present Illness: 40 yo smoker who was admitted to [facility] 9/25 - 10/12 for bilateral MSSA cavitary PNA's c/b L empyema, VDRF. She underwent L CT placement and then a second anterior pigtail to drain residual hydropneumothorax. She went home on IV Ancef and finished 6 weeks of therapy. Pigtail was d/c'd on 02/05/08, had CT chest that same day that showed resolution of L hydroPTX, some inferior residual effusion, bilateral parenchymal disease, better than during hospitalization (10/6).   ROV 01/06/09 -- returns for f/u COPD. Has been dx with LE DVT, started on coumadin. Tells me today that she has been having more trouble breathing for about 3 weeks. Doesnt feel that the spiriva helps as much as it used to. Has been using ventolin several times a day. Has had some wheeze and cold symptoms today. Still smoking 1 pk a day.   ROV 02/12/09 -- since last visit was seen at [facility] for an acute exacerbation.  Returns for f/u. Continues to have dyspnea  - sometimes at rest, feels like she can't take a deep breath. She is on Spiriva, takes combivent two times a day, ventolin as needed. No cough, occas wheeze - happens every day with exertion.  Continues to smoke 1 pk a day, stopped wellbutrin.   ROV 03/16/09 -- Since last visit, she has stopped cigarettes! Stopped on Nov 5. Was taking wellbutrin, then stopped it and started on chantix (? dose) two times a day. Also since last visit, started on Symbicort 80 two times a day. She feels better altogether since last visit, more energy.   ROV 05/29/09 -- Acute visit. I treated her for an apparent exacerbation with Pred and doxy by phone 2/7. Here today to report chest tightness, fatigue, poor by mouth intake, fevers, some chills.  Coughing greenish phlegm. Off chantix. Restarted cigarettes 3 weeks ago, 5cig a day. Doesn't feel the Pred has helped her, if anything her breathing is tighter. On 40mg  by mouth once daily currently  Preventive Screening-Counseling & Management  Alcohol-Tobacco     Smoking Status: current     Smoke Cessation Stage: relapse     Packs/Day: <0.25  Current Medications (verified): 1)  Ventolin Hfa 108 (90 Base) Mcg/act Aers (Albuterol Sulfate) .... Inhale 2 Puffs Every 4 Hours As Needed 2)  Spiriva Handihaler 18 Mcg Caps (Tiotropium Bromide Monohydrate) .Marland Kitchen.. 1 Inhaled Once Daily 3)  Albuterol Sulfate (2.5 Mg/59ml) 0.083%  Nebu (Albuterol Sulfate) .Marland Kitchen.. 1 Vial Via Hhn Every 4-6hrs As Needed For Shortness of Breath 4)  Coumadin 10 Mg Tabs (Warfarin Sodium) .... Take One Tablet Daily As Directed By Coumadin Clinic 5)  Symbicort 160-4.5 Mcg/act Aero (Budesonide-Formoterol Fumarate) .... 2 Puffs Two Times A Day 6)  Doxycycline Hyclate 100 Mg Tabs (Doxycycline Hyclate) .Marland Kitchen.. 1 By Mouth Two Times A Day 7)  Prednisone 10 Mg Tabs (Prednisone) .... 40mg  Daily X 3days, 30mg  Daily X 3days, 20mg  Daily X 3days, 10mg  Daily X3 Days Then Stop.  Allergies (verified): 1)  ! Penicillin 2)  ! Ceclor  Social History: Packs/Day:  <0.25  Vital Signs:  Patient profile:   40 year old female Height:      64 inches (162.56 cm) Weight:      188 pounds (  85.45 kg) BMI:     32.39 O2 Sat:      92 % on Room air Temp:     98.2 degrees F (36.78 degrees C) oral Pulse rate:   90 / minute BP sitting:   126 / 80  (right arm) Cuff size:   regular  Vitals Entered By: Michel Bickers CMA (May 29, 2009 10:58 AM)  O2 Sat at Rest %:  92 O2 Flow:  Room air CC: Acute visit. COPD. Smoker. The patient c/o increased sob and cough with green mucus and fever off and on x1 week. She is on a Prednisone taper at 40mg  today and on Doxycycline.   Physical Exam  General:  alert and well-developed.  mildly anxious appearing Head:   normocephalic and atraumatic Eyes:  conjunctiva and sclera clear Nose:  clear drainage Mouth:  post pharynx with erythema Neck:  supple.   Chest Wall:  well-healed CT and pigtail cath sites Lungs:  low pitched exp wheezes throughout all fields Heart:  normal rate, regular rhythm, and no murmur.   Abdomen:  not examined Msk:  no deformity or scoliosis noted with normal posture Extremities:  no clubbing, cyanosis, edema, or deformity noted Neurologic:  non-focal Skin:  no rashes.   Psych:  alert and cooperative; normal mood and affect; normal attention span and concentration   Impression & Recommendations:  Problem # 1:  COPD (ICD-496) With acute exacerbation. hasn't really responded to pred taper + doxy.  - CXR today without infiltrates - avelox x 7 days - finish pred taper (currently on 40) - call if no better next 2 -3 days  Problem # 2:  TOBACCO ABUSE (ICD-305.1) back to smoking, off chantix.  - need to revisit another attempt to quit once she recovers from this exacerbation The following medications were removed from the medication list:    Chantix 1 Mg Tabs (Varenicline tartrate) .Marland Kitchen... 1 by mouth two times a day for 2 months  Medications Added to Medication List This Visit: 1)  Avelox 400 Mg Tabs (Moxifloxacin hcl) .Marland Kitchen.. 1 by mouth once daily 2)  Tussionex Pennkinetic Er 8-10 Mg/45ml Lqcr (Chlorpheniramine-hydrocodone) .... 5cc by mouth q12h as needed cough  Other Orders: T-2 View CXR (71020TC) Est. Patient Level IV (62130)  Patient Instructions: 1)  Take your same inhaled medications as ordered. 2)  Start Avelox 400mg  by mouth once daily for a week 3)  Use tussionex as needed for cough.  4)  Call our office if your breathing does not improve on these medications.  5)  We will need to revisit stopping smoking at some point in the future.  Prescriptions: TUSSIONEX PENNKINETIC ER 8-10 MG/5ML LQCR (CHLORPHENIRAMINE-HYDROCODONE) 5cc by mouth q12h as needed cough  #4oz x  0   Entered and Authorized by:   Leslye Peer MD   Signed by:   Leslye Peer MD on 05/29/2009   Method used:   Print then Give to Patient   RxID:   [id number] AVELOX 400 MG TABS (MOXIFLOXACIN HCL) 1 by mouth once daily  #7 x 0   Entered and Authorized by:   Leslye Peer MD   Signed by:   Leslye Peer MD on 05/29/2009   Method used:   Print then Give to Patient   RxID:   [id number]

## 2010-05-18 NOTE — Letter (Signed)
Summary: [facility]  [address]   [place], [place] [address]   Phone: [phone]  Fax: [phone]    10/05/2009  RE: [name] [address] [facility] [address], [place]  [address]  [place]  To Whom it May Concern: Ms [name] is a patient under my care at [facility]. She has been diagnosed with Chronic Obstructive Pulmonary Disease (COPD) and is on daily medications for this problem. Her FEV1 on pulmonary function testing falls into the Moderately Severe range, and she has been clinically limited by her disease.   Sincerely,   Levy Pupa MD

## 2010-05-20 NOTE — Progress Notes (Signed)
Summary: start albuterol nebs and chantix  Phone Note Call from Patient Call back at [facility] Phone [phone]   Caller: Patient Call For: [name] Summary of Call: NEEDS REFILL ON ALBUTEROL FOR HER NEBULIZER ALSO WANTS CHANTIX TO QUIT SMOKING [facility]  Initial call taken by: Lacinda Axon,  May 06, 2010 12:21 PM  Follow-up for Phone Call        Called and spoke with pt.  She is requesting albuterol neb sol.  She states that she stopped taking this b/c it was not helping a while back, then had recent trip to the ED with COPD flare and was given albuterol neb tx and states that this really helped this time.  They sent her home with a few, but now she is out and feels like she needs this again.  RB out of the office, so will ask doc of the day.  Also she states ready to quit smoking and wants chantix.  I advised may need to wait for RB's approval before giving this buit will ask as well.  Pls advise thanks! Follow-up by: Vernie Murders,  May 06, 2010 2:49 PM  Additional Follow-up for Phone Call Additional follow up Details #1::        Per CDY-give Albuterol 0.083% neb solution #25 1 vial via nebulizer four times a day as needed with 5 refills.Reynaldo Minium CMA  May 06, 2010 4:50 PM     Additional Follow-up for Phone Call Additional follow up Details #2::    [facility] for pt to be aware that albuterol was sent to pharm.  RB, is it okay to give rx for chantix? Pls advise thanks! Follow-up by: Vernie Murders,  May 06, 2010 4:58 PM  Additional Follow-up for Phone Call Additional follow up Details #3:: Details for Additional Follow-up Action Taken: Yes, please give chantix and instructions on when to quit smoking/. Thanks Leslye Peer MD  May 11, 2010 10:23 AM   Spoke with pt and notified rx for chantic will be sent, needs to pick a quit date and then start med 1 wk prior to this.  Advised call if need for support/issues with med.   Additional Follow-up by: Vernie Murders,  May 11, 2010 10:26 AM  New/Updated Medications: ALBUTEROL SULFATE (2.5 MG/3ML) 0.083% NEBU (ALBUTEROL SULFATE) 1 vial in nebulizer four times a day as needed CHANTIX STARTING MONTH PAK 0.5 MG X 11 & 1 MG X 42 TABS (VARENICLINE TARTRATE) take as directed Prescriptions: CHANTIX STARTING MONTH PAK 0.5 MG X 11 & 1 MG X 42 TABS (VARENICLINE TARTRATE) take as directed  #1 x 0   Entered by:   Vernie Murders   Authorized by:   Leslye Peer MD   Signed by:   Vernie Murders on 05/11/2010   Method used:   [facility]  [address] [facility]       [address], [place]  [address]       Ph: [phone]       Fax: [phone]   RxID:   [id number] ALBUTEROL SULFATE (2.5 MG/3ML) 0.083% NEBU (ALBUTEROL SULFATE) 1 vial in nebulizer four times a day as needed  #25 x 5   Entered by:   Vernie Murders   Authorized by:   Waymon Budge MD   Signed by:   Vernie Murders on 05/06/2010   Method used:   [facility]  [address] [facility]       [address] [place], [place]  [address]       Ph: [phone]       Fax: [phone]   RxID:   [id number]

## 2010-05-20 NOTE — Progress Notes (Signed)
Summary: Chantix changed to Wellbutrin 150mg <<<pt is aware  Phone Note Call from Patient   Caller: Patient Call For: [facility] Summary of Call: Patient phoned stated that Dr Delton Coombes called her in a rx for Chantix yesterday but when she went to pick it up they told her that it had to be preauthorized. [facility] [place]. Patient can be reached [phone] Initial call taken by: Vedia Coffer,  May 12, 2010 11:51 AM  Follow-up for Phone Call        Spoke with pharmacist to check and see if PA can even be done on this med.  He states that her ins will allow this and so I asked that he fax [place] the PA request form to triage.  Will await fax- Vernie Murders  May 12, 2010 12:02 PM  Recieved PA request form.  Called ins co and was advised that this med is not covered under her plan and no PA can be obtained.  There were no alternatives given.  I called and advised of this.  I asked what she has tried before besides chantix and she states that she has only tried this and one other med that she does not recall the name of.  She states that she does not think she has tried wellbutrin, patches or nicotrol inhaler. Pt states that her breathing is continuing to worsen, and needs something to help her stop asap.  Pls advise thanks Follow-up by: Vernie Murders,  May 12, 2010 4:11 PM  Additional Follow-up for Phone Call Additional follow up Details #1::        Wellbutrin is the other choice. I can't recall whether we've done this before. We can order for her if she wants.  Additional Follow-up by: Leslye Peer MD,  May 12, 2010 5:27 PM    Additional Follow-up for Phone Call Additional follow up Details #2::    LMOMTCB Vernie Murders  May 12, 2010 5:33 PM  Pt states she is willing to try Wellbutrin. Please advise strength and dosing on same for smoking cessation.  Pt has car trouble and can call someone this am close to her pharmacy. Dr. Delton Coombes is not in the office today and pt  would  like this script called in ASAP. Will forward to "doc of the day".  Please advise. Thanks. Zackery Barefoot CMA  May 13, 2010 9:59 AM   Allergies  1)  ! Penicillin 2)  ! Ceclor   Additional Follow-up for Phone Call Additional follow up Details #3:: Details for Additional Follow-up Action Taken: ok welbutrin 150 mg, # 50 1 daily x 1 week, then one two times a day, ref x 1 this will go back to Dr Delton Coombes for long term management   Pt is aware of RX and directions for use.Michel Bickers [facility]  May 13, 2010 10:53 AM Additional Follow-up by: Waymon Budge MD,  May 13, 2010 10:41 AM  New/Updated Medications: WELLBUTRIN SR 150 MG XR12H-TAB (BUPROPION HCL) 1 by mouth daily x1 week. Then start 1 by mouth two times a day. Prescriptions: WELLBUTRIN SR 150 MG XR12H-TAB (BUPROPION HCL) 1 by mouth daily x1 week. Then start 1 by mouth two times a day.  #50 x 1   Entered by:   Michel Bickers CMA   Authorized by:   Leslye Peer MD   Signed by:   Michel Bickers CMA on 05/13/2010   Method used:   [facility]  [facility]  [address] [facility]       [address] [place], [place]  [address]       Ph: [phone]       Fax: [phone]   RxID:   [id number]

## 2010-07-05 ENCOUNTER — Telehealth (INDEPENDENT_AMBULATORY_CARE_PROVIDER_SITE_OTHER): Payer: Self-pay | Admitting: *Deleted

## 2010-07-07 LAB — DIFFERENTIAL
Basophils Absolute: 0 10*3/uL (ref 0.0–0.1)
Basophils Relative: 0 % (ref 0–1)
Eosinophils Absolute: 0 10*3/uL (ref 0.0–0.7)
Lymphocytes Relative: 17 % (ref 12–46)
Lymphs Abs: 0.8 10*3/uL (ref 0.7–4.0)
Lymphs Abs: 0.9 10*3/uL (ref 0.7–4.0)
Monocytes Absolute: 0.2 10*3/uL (ref 0.1–1.0)
Monocytes Relative: 4 % (ref 3–12)
Neutro Abs: 2.9 10*3/uL (ref 1.7–7.7)
Neutro Abs: 4.1 10*3/uL (ref 1.7–7.7)
Neutrophils Relative %: 76 % (ref 43–77)
Neutrophils Relative %: 77 % (ref 43–77)

## 2010-07-07 LAB — BLOOD GAS, ARTERIAL
Bicarbonate: 24.2 mEq/L — ABNORMAL HIGH (ref 20.0–24.0)
FIO2: 0.21 %
O2 Content: 2 L/min
pCO2 arterial: 42.7 mmHg (ref 35.0–45.0)
pH, Arterial: 7.363 (ref 7.350–7.400)
pH, Arterial: 7.381 (ref 7.350–7.400)
pO2, Arterial: 64.1 mmHg — ABNORMAL LOW (ref 80.0–100.0)
pO2, Arterial: 65.2 mmHg — ABNORMAL LOW (ref 80.0–100.0)

## 2010-07-07 LAB — BASIC METABOLIC PANEL
BUN: 9 mg/dL (ref 6–23)
Calcium: 8.1 mg/dL — ABNORMAL LOW (ref 8.4–10.5)
Creatinine, Ser: 0.8 mg/dL (ref 0.4–1.2)
GFR calc non Af Amer: >60 mL/min (ref >60)
Glucose, Bld: 125 mg/dL — ABNORMAL HIGH (ref 70–99)
Potassium: 3.7 mEq/L (ref 3.5–5.1)

## 2010-07-07 LAB — COMPREHENSIVE METABOLIC PANEL
ALT: 25 U/L (ref 0–35)
Albumin: 3 g/dL — ABNORMAL LOW (ref 3.5–5.2)
Alkaline Phosphatase: 81 U/L (ref 39–117)
Calcium: 8.5 mg/dL (ref 8.4–10.5)
Glucose, Bld: 134 mg/dL — ABNORMAL HIGH (ref 70–99)
Potassium: 4 mEq/L (ref 3.5–5.1)
Sodium: 140 mEq/L (ref 135–145)
Total Protein: 5.8 g/dL — ABNORMAL LOW (ref 6.0–8.3)

## 2010-07-07 LAB — CBC
MCHC: 33.4 g/dL (ref 30.0–36.0)
Platelets: 271 10*3/uL (ref 150–400)
Platelets: 282 10*3/uL (ref 150–400)
RDW: 15.3 % (ref 11.5–15.5)
RDW: 15.5 % (ref 11.5–15.5)
WBC: 5.3 10*3/uL (ref 4.0–10.5)

## 2010-07-07 LAB — PROTIME-INR
INR: 1.56 — ABNORMAL HIGH (ref 0.00–1.49)
INR: 1.59 — ABNORMAL HIGH (ref 0.00–1.49)
Prothrombin Time: 18.8 seconds — ABNORMAL HIGH (ref 11.6–15.2)

## 2010-07-15 NOTE — Progress Notes (Signed)
Summary: refill  Phone Note Call from Patient Call back at Home Phone [phone]   Caller: Patient Call For: byrum Reason for Call: Talk to Nurse Summary of Call: Patient needing refill-ventolin--[facility]  [phone] Initial call taken by: Lehman Prom,  July 05, 2010 9:47 AM  Follow-up for Phone Call        [facility] that refill was sent to pharmacy. Abigail Miyamoto RN  July 05, 2010 10:30 AM     Prescriptions: VENTOLIN HFA 108 (90 BASE) MCG/ACT AERS (ALBUTEROL SULFATE) Inhale 2 puffs every 4 hours as needed  #1 x 11   Entered by:   Abigail Miyamoto RN   Authorized by:   Leslye Peer MD   Signed by:   Abigail Miyamoto RN on 07/05/2010   Method used:   [facility]        [facility]  [address] [facility]       [address]       [place], [place]  [address]       Ph: [phone]       Fax: [phone]   RxID:   [id number]

## 2010-07-21 LAB — DIFFERENTIAL
Basophils Absolute: 0 10*3/uL (ref 0.0–0.1)
Eosinophils Absolute: 0 10*3/uL (ref 0.0–0.7)
Eosinophils Relative: 0 % (ref 0–5)
Lymphocytes Relative: 17 % (ref 12–46)
Monocytes Absolute: 0.6 10*3/uL (ref 0.1–1.0)

## 2010-07-21 LAB — COMPREHENSIVE METABOLIC PANEL
ALT: 15 U/L (ref 0–35)
AST: 15 U/L (ref 0–37)
Albumin: 3.5 g/dL (ref 3.5–5.2)
Alkaline Phosphatase: 84 U/L (ref 39–117)
CO2: 24 mEq/L (ref 19–32)
Chloride: 103 mEq/L (ref 96–112)
Creatinine, Ser: 0.5 mg/dL (ref 0.4–1.2)
GFR calc Af Amer: >60 mL/min (ref >60)
Potassium: 3.6 mEq/L (ref 3.5–5.1)
Sodium: 136 mEq/L (ref 135–145)
Total Bilirubin: 0.3 mg/dL (ref 0.3–1.2)

## 2010-07-21 LAB — CBC
Platelets: 290 10*3/uL (ref 150–400)
RBC: 3.96 MIL/uL (ref 3.87–5.11)
WBC: 9 10*3/uL (ref 4.0–10.5)

## 2010-07-23 LAB — PROTIME-INR: Prothrombin Time: 22.8 seconds — ABNORMAL HIGH (ref 11.6–15.2)

## 2010-07-24 LAB — BASIC METABOLIC PANEL
BUN: 2 mg/dL — ABNORMAL LOW (ref 6–23)
CO2: 28 mEq/L (ref 19–32)
CO2: 28 mEq/L (ref 19–32)
Calcium: 9 mg/dL (ref 8.4–10.5)
Chloride: 102 mEq/L (ref 96–112)
Chloride: 103 mEq/L (ref 96–112)
Chloride: 104 mEq/L (ref 96–112)
Creatinine, Ser: 0.42 mg/dL (ref 0.4–1.2)
Creatinine, Ser: 0.53 mg/dL (ref 0.4–1.2)
GFR calc Af Amer: >60 mL/min (ref >60)
GFR calc Af Amer: >60 mL/min (ref >60)
Glucose, Bld: 100 mg/dL — ABNORMAL HIGH (ref 70–99)
Potassium: 3.7 mEq/L (ref 3.5–5.1)
Sodium: 135 mEq/L (ref 135–145)

## 2010-07-24 LAB — DIFFERENTIAL
Basophils Absolute: 0 10*3/uL (ref 0.0–0.1)
Basophils Absolute: 0 10*3/uL (ref 0.0–0.1)
Basophils Relative: 0 % (ref 0–1)
Basophils Relative: 0 % (ref 0–1)
Eosinophils Absolute: 0 10*3/uL (ref 0.0–0.7)
Eosinophils Absolute: 0 10*3/uL (ref 0.0–0.7)
Eosinophils Absolute: 0 10*3/uL (ref 0.0–0.7)
Eosinophils Relative: 0 % (ref 0–5)
Eosinophils Relative: 0 % (ref 0–5)
Lymphocytes Relative: 25 % (ref 12–46)
Lymphs Abs: 1.4 10*3/uL (ref 0.7–4.0)
Lymphs Abs: 1.4 10*3/uL (ref 0.7–4.0)
Monocytes Absolute: 0.5 10*3/uL (ref 0.1–1.0)
Monocytes Absolute: 0.6 10*3/uL (ref 0.1–1.0)
Monocytes Relative: 8 % (ref 3–12)
Neutro Abs: 4.9 10*3/uL (ref 1.7–7.7)
Neutro Abs: 9.6 10*3/uL — ABNORMAL HIGH (ref 1.7–7.7)
Neutrophils Relative %: 73 % (ref 43–77)
Neutrophils Relative %: 80 % — ABNORMAL HIGH (ref 43–77)

## 2010-07-24 LAB — CBC
HCT: 31 % — ABNORMAL LOW (ref 36.0–46.0)
HCT: 33.1 % — ABNORMAL LOW (ref 36.0–46.0)
Hemoglobin: 10.5 g/dL — ABNORMAL LOW (ref 12.0–15.0)
MCHC: 34.3 g/dL (ref 30.0–36.0)
MCHC: 34.4 g/dL (ref 30.0–36.0)
MCV: 92.8 fL (ref 78.0–100.0)
MCV: 93.2 fL (ref 78.0–100.0)
MCV: 93.5 fL (ref 78.0–100.0)
MCV: 94 fL (ref 78.0–100.0)
Platelets: 254 10*3/uL (ref 150–400)
Platelets: 267 10*3/uL (ref 150–400)
RBC: 4.01 MIL/uL (ref 3.87–5.11)
RDW: 14.6 % (ref 11.5–15.5)
RDW: 14.8 % (ref 11.5–15.5)
RDW: 15.1 % (ref 11.5–15.5)
WBC: 11.9 10*3/uL — ABNORMAL HIGH (ref 4.0–10.5)

## 2010-07-24 LAB — LIPID PANEL
Cholesterol: 129 mg/dL (ref 0–200)
LDL Cholesterol: 82 mg/dL (ref 0–99)

## 2010-07-24 LAB — PROTIME-INR
INR: 1.1 (ref 0.00–1.49)
Prothrombin Time: 13.5 seconds (ref 11.6–15.2)
Prothrombin Time: 14.7 seconds (ref 11.6–15.2)

## 2010-07-24 LAB — APTT: aPTT: 47 seconds — ABNORMAL HIGH (ref 24–37)

## 2010-07-24 LAB — POCT CARDIAC MARKERS
CKMB, poc: <1 ng/mL — ABNORMAL LOW (ref 1.0–8.0)
Myoglobin, poc: 58.9 ng/mL (ref 12–200)

## 2010-07-27 LAB — POCT I-STAT, CHEM 8
Creatinine, Ser: 0.7 mg/dL (ref 0.4–1.2)
HCT: 42 % (ref 36.0–46.0)
Hemoglobin: 14.3 g/dL (ref 12.0–15.0)
Potassium: 3.7 mEq/L (ref 3.5–5.1)
Sodium: 139 mEq/L (ref 135–145)
TCO2: 25 mmol/L (ref 0–100)

## 2010-07-27 LAB — CBC
HCT: 40.4 % (ref 36.0–46.0)
MCHC: 33.5 g/dL (ref 30.0–36.0)
MCV: 92.7 fL (ref 78.0–100.0)
Platelets: 292 10*3/uL (ref 150–400)
RDW: 15 % (ref 11.5–15.5)

## 2010-07-27 LAB — DIFFERENTIAL
Basophils Relative: 0 % (ref 0–1)
Eosinophils Absolute: 0 10*3/uL (ref 0.0–0.7)
Eosinophils Relative: 0 % (ref 0–5)
Neutrophils Relative %: 68 % (ref 43–77)

## 2010-07-28 LAB — BASIC METABOLIC PANEL
CO2: 27 mEq/L (ref 19–32)
Calcium: 8.9 mg/dL (ref 8.4–10.5)
Creatinine, Ser: 0.57 mg/dL (ref 0.4–1.2)
GFR calc Af Amer: >60 mL/min (ref >60)
GFR calc non Af Amer: >60 mL/min (ref >60)
Glucose, Bld: 119 mg/dL — ABNORMAL HIGH (ref 70–99)
Sodium: 139 mEq/L (ref 135–145)

## 2010-07-28 LAB — URINALYSIS, ROUTINE W REFLEX MICROSCOPIC
Bilirubin Urine: NEGATIVE
Hgb urine dipstick: NEGATIVE
Nitrite: NEGATIVE
Protein, ur: NEGATIVE mg/dL
Specific Gravity, Urine: 1.02 (ref 1.005–1.030)
Urobilinogen, UA: 0.2 mg/dL (ref 0.0–1.0)

## 2010-07-28 LAB — DIFFERENTIAL
Basophils Absolute: 0 10*3/uL (ref 0.0–0.1)
Basophils Relative: 0 % (ref 0–1)
Lymphocytes Relative: 24 % (ref 12–46)
Neutro Abs: 5.9 10*3/uL (ref 1.7–7.7)
Neutrophils Relative %: 69 % (ref 43–77)

## 2010-07-28 LAB — CBC
MCHC: 34.2 g/dL (ref 30.0–36.0)
Platelets: 284 10*3/uL (ref 150–400)
RDW: 18.8 % — ABNORMAL HIGH (ref 11.5–15.5)

## 2010-08-07 ENCOUNTER — Other Ambulatory Visit: Payer: Self-pay | Admitting: Emergency Medicine

## 2010-08-09 ENCOUNTER — Encounter: Payer: Self-pay | Admitting: Emergency Medicine

## 2010-08-11 ENCOUNTER — Ambulatory Visit: Payer: Self-pay | Admitting: Emergency Medicine

## 2010-08-27 ENCOUNTER — Ambulatory Visit (INDEPENDENT_AMBULATORY_CARE_PROVIDER_SITE_OTHER): Payer: Medicaid - Out of State | Admitting: Emergency Medicine

## 2010-08-27 ENCOUNTER — Encounter: Payer: Self-pay | Admitting: Emergency Medicine

## 2010-08-27 DIAGNOSIS — J449 Chronic obstructive pulmonary disease, unspecified: Secondary | ICD-10-CM

## 2010-08-27 NOTE — Patient Instructions (Signed)
Continue your Spiriva daily and Symbicort twice a day. Do not use the Symbicort more frequently Use your albuterol inhaler 2 puffs up to every 4 hours if needed for shortness of breath.  By your next visit, we have agreed that you will have cut down to 15 cigarettes a day.  Follow with Dr Delton Coombes in 3 months

## 2010-08-27 NOTE — Progress Notes (Signed)
  Subjective:    Patient ID: [name], female    DOB: Apr 11, 1971, 40 y.o.   MRN: [id number]  HPI  40 yo smoker who was admitted to [facility] 9/25 - 10/12 for bilateral MSSA cavitary PNA's c/b L empyema, VDRF. She underwent L CT placement and then a second anterior pigtail to drain residual hydropneumothorax. She went home on IV Ancef and finished 6 weeks of therapy. Pigtail was d/c'd on 02/05/08. Followed now for COPD, tobacco use.   ROV 05/29/09 -- Acute visit. I treated her for an apparent exacerbation with Pred and doxy by phone 2/7. Here today to report chest tightness, fatigue, poor by mouth intake, fevers, some chills. Coughing greenish phlegm. Off chantix. Restarted cigarettes 3 weeks ago, 5cig a day. Doesn't feel the Pred has helped her, if anything her breathing is tighter. On 40mg  by mouth once daily currently   ROV 07/07/09 -- Returns for f/u. Off prednisone. Tells me that she is about the same. Her breathing is still limited with exertion. She has noticed some HA's. Also having some periods of heart fluttering associated with some lightheadedness. She doesn't necessarily relate these to her bronchodilator. Wheezing and cough are still problems - unchanged. Still smoking 1/2 pk a day.   ROV 10/05/09 -- regular f/u COPD. Since last visit has had high stress, has lost some wt. Her EtOH use and her cigarette use have both gone up, now 2pks/day. Stopped Spiriva and Symbicort 1 month ago. Her breathing worsened slightly, but she feels she has tolerated being off. Her Ventolin use has increased some. No exacerbations since last visit.   ROV 03/19/10 -- COPD, tobacco use. Still smoking 1 pk/day. Having SOB with both exertion and sometimes at rest. No fevers, coughs frequently, yellow mucous, no hemoptysis. Describes a pain in L chest below L breast, has moved to flank and L back, notices with deep breath.   ROV 08/27/10 -- COPD, tobacco abuse (1 to 1.5 pk/day), hx MSSA cavitary PNA in 2009. Returns for  F/u. Since last visit she tried Wellbutrin, but stopped it due to labile mood. Remains on Spiriva + Symbicort, has been using ventolin more frequently; has been using 7-8 x a day.     Review of Systems As above    Objective:   Physical Exam Gen: Pleasant, well-nourished, in no distress,  normal affect  ENT: No lesions,  mouth clear,  oropharynx clear, no postnasal drip  Neck: No JVD, no TMG, no carotid bruits  Lungs: distant, coarse B, soft exp wheeze  Cardiovascular: RRR, heart sounds normal, no murmur or gallops, no peripheral edema  Musculoskeletal: No deformities, no cyanosis or clubbing  Neuro: alert, non focal  Skin: Warm, no lesions or rashes        Assessment & Plan:

## 2010-08-27 NOTE — Assessment & Plan Note (Signed)
Still smoking, overusing her BD's - Reviewed proper scheduling for her Symbicort and ventolin - made goal of cutting down to 15 cig a day by next visit in 3 months

## 2010-08-31 NOTE — Discharge Summary (Signed)
[name], [name]                  ACCOUNT NO.:  [id number]   MEDICAL RECORD NO.:  [id number]          PATIENT TYPE:  INP   LOCATION:  A339                          FACILITY:  [facility]   PHYSICIAN:  Skeet Latch, DO    DATE OF BIRTH:  1971-04-13   DATE OF ADMISSION:  12/12/2007  DATE OF DISCHARGE:  08/28/2009LH                               DISCHARGE SUMMARY   DISCHARGE DIAGNOSES:  1. Acute exacerbation of asthma, improved.  2. Tachycardia.  3. Anemia.  4. History of tobacco abuse.   BRIEF HOSPITAL COURSE:  Please see H&P done by Dr. Rito Ehrlich on December 12, 2007.  Briefly, this is a 40 year old Caucasian female who presented  with shortness of breath.  Apparently, the patient uses crack cocaine  and states that she started feeling more short of breath and started  wheezing approximately 4 days prior to admission.  The patient is also  complaining of productive sputum.  The patient was admitted.  She had a  fever of 102.  She had some bilateral sharp chest pain that increased  with deep breathing and coughing.  Her initial labs showed a white count  of 9200, hemoglobin was 11, platelet count 360,000.  Her chest x-ray  showed stable changes of COPD versus asthma, no other acute  abnormalities.  The patient presented with these symptoms and with her  shortness of breath.  Secondary to her shortness of breath and her  tachycardia, the patient had a D-dimer performed, which was elevated at  0.52.  The patient had an ABG, which was fairly unremarkable; pO2 was  67.7, but her pH was 7.390, pCO2 is 37.9, and bicarb was 22.5.  Secondary to her elevated D-dimer, she had a CT angiogram of the chest  performed, which showed patchy, nodular, and ground-glass airspace  disease in the right upper lobe, right middle lobe, and lingula.  Given  the areas of effusion and appearance, I favor infection over  inflammation; showed COPD; no evidence of pulmonary embolus.  Her  initial chest x-ray showed  stable changes of COPD versus asthma.  The  patient was placed on IV steroid and placed on IV antibiotics.  The  patient was hypokalemic, this was replaced, now is in normal range.  Next, the patient received nebulized treatments, and her breathing has  been improving on a daily basis.  The patient was placed on a nicotine  patch for her tobacco abuse also.  The patient also had an EKG  performed, showed normal sinus rhythm, showed some minimal voltage  criteria for LVH, and showed some slight ST abnormalities.  No Q waves  were noted.  At this time, we feel the patient is stable enough to be  discharged, and the patient wants to go home.   MEDICATIONS ON DISCHARGE:  1. Proventil inhaler 2 puffs every 4-6 hours as needed.  2. Medrol Dosepak as directed.   VITALS ON DISCHARGE:  Temperature is 98.1, pulse 79, respirations 20,  blood pressure 124/70, and she is satting 92%.   LABORATORY DATA:  Sodium 141, potassium 4.0, chloride  115, CO2 is 25,  glucose 186, BUN 8, creatine 0.48.  White count is 16,500, hemoglobin  9.9, hematocrit 29.9, and platelet count is 396.   CONDITION ON DISCHARGE:  Stable.   DISPOSITION:  The patient will be discharged home.   DISCHARGE INSTRUCTIONS:  The patient is to maintain a regular diet.  The  patient is to increase her activity slowly.  She is to follow up with  the Health Department in the next 3-5 days.  The patient is to take her  medications as directed.  The patient will return to the emergency room  if she has any increasing shortness of breath and/or call 911.      Skeet Latch, DO  Electronically Signed     SM/MEDQ  D:  12/14/2007  T:  12/15/2007  Job:  [id number]

## 2010-08-31 NOTE — Discharge Summary (Signed)
[name], [name]                  ACCOUNT NO.:  [id number]   MEDICAL RECORD NO.:  [id number]          PATIENT TYPE:  INP   LOCATION:  A320                          FACILITY:  [facility]   PHYSICIAN:  Dorris Singh, DO    DATE OF BIRTH:  March 09, 1971   DATE OF ADMISSION:  12/23/2006  DATE OF DISCHARGE:  09/09/2008LH                               DISCHARGE SUMMARY   ADMISSION DIAGNOSES:  1. Community-acquired pneumonia.  2. History of crack cocaine abuse.  3. Tobacco abuse.   DISCHARGE DIAGNOSES:  1. Community-acquired pneumonia.  2. History of crack cocaine abuse.  3. Tobacco abuse.   PRIMARY CARE PHYSICIAN:  The patient does not have one.  We will have  her assigned an appointment with the one on call.   CONSULTATIONS:  There were not any.   X-RAYS:  The patient had a chest x-ray done on September 8 which showed  bronchitic changes comparable with a history of asthma and improving  bilateral lower lobe infiltrates.   She had a CT on September 6 which showed no active pulmonary emboli,  however, diffuse air space disease consistent with infection .  When he  infection clears, they recommended she have a repeat CT.   Right hilar adenopathy with reactive lymphadenopathy and bilateral lower  lobe bronchopneumonia with a chest x-ray done on September 6.   HISTORY AND PHYSICAL:  Please refer to that.   HOSPITAL COURSE:  The patient was then started on IV steroids,  antibiotics and handheld nebulizers.  The patient was informed not to  smoke, placed her on patch, and she continued to progress. Did not have  any issues.  Each day her shortness of breath became better.  On  September 8, the patient asked if she was well enough to go home, was  looking forward to being discharged. I explained to her she could be  discharged in 1-2 days.  On September 9, the patient stated she was  ready to go home.  She has a nebulizer at home with the medication. She  would need steroids to help her  and has stated that she will find a  primary care physician at this point in time.  At this point, the  patient is stable, and her disposition is good.  She will be discharged  to home on the following medications.   She will be discharged on her home medications which include:  1. Ventolin.  2. Albuterol inhaler 1 inhalation every 4 hours.  3. Primatene Mist over-the-counter as needed.  4. Tylenol as needed.  5. Tylenol with codeine as needed.  6. Aspirin.  7. Advil.  These are the medications she came on.  8. She is being sent home on Tussionex 10 mL p.o. q.12 h,  9. Medrol Dosepak, use as directed.  10.Levaquin 750 one a day x10 days.   FOLLOWUP:  We are to make an appointment with her for the primary care  physician on call and anticipate her to be seen in 1-3 days.      Dorris Singh, DO  Electronically Signed  CB/MEDQ  D:  12/26/2006  T:  12/26/2006  Job:  [id number]

## 2010-08-31 NOTE — Consult Note (Signed)
[name], [name]                  ACCOUNT NO.:  [id number]   MEDICAL RECORD NO.:  [id number]          PATIENT TYPE:  INP   LOCATION:  IC08                          FACILITY:  [facility]   PHYSICIAN:  Tilford Pillar, MD      DATE OF BIRTH:  1970/09/28   DATE OF CONSULTATION:  01/11/2008  DATE OF DISCHARGE:  01/11/2008                                 CONSULTATION   REASON FOR CONSULTATION:  Sepsis requiring central venous catheter  access.   HISTORY OF PRESENT ILLNESS:  The patient is a 40 year old female who  presented to [facility] with increasing shortness of breath.  She was diagnosed with viral pneumonia and was suspected of early  sepsis.  Additionally, during the workup, it was also suspected by  interviewing the family that she potentially could also be exposed to  rabies virus.  However, due to her sepsis, it was recommend that she  have a central venous catheter placed for continued monitoring as well  as additional access.  The patient is currently sedated on propofol, and  the chart was reviewed.   PAST MEDICAL HISTORY:  Positive for asthma, pneumonia.   PAST SURGICAL HISTORY:  Upper thoracic procedure for history of  fractured clavicle.   MEDICATIONS:  Reviewed, she is not on any anticoagulation.   FOCUSED PHYSICAL EXAMINATION:  The patient is sedated on propofol.  She  is in a supine position in the hospital bed in the intensive care unit.  She is currently intubated, not being mechanically ventilated.  Trachea  is midline.  No cervical lymphadenopathy.  No clavicular deformities or  lesions on either side.  No loss of thoracic excursion noted.  She has  2+ radial pulses bilaterally.   Laboratory values were reviewed.   ASSESSMENT AND PLAN:  Sepsis requiring IV access.  A consent will be  obtained from the family for a planned intravenous catheter placement.  Risk, benefits, and alternatives were discussed with them.  At this  point, we will attempt a  subclavian vein placement.   PROCEDURE:  At this point, the patient's chest wall was prepped with  chlorhexidine using a sterile dressing placed in a standard fashion.  Using a sterile Seldinger technique, an 18-gauge introducer needle was  utilized to identify the left subclavian vein.  Good venous return was  obtained and J-wire was advanced using Seldinger technique.  The  catheter was advanced over the wire.  J-wire was removed.  The catheter  was advanced to 17 cm.  All 3 ports were easily flushed and aspirated.  A 2-0 silk was utilized to affix the catheter to the skin.  Sterile  dressing was placed.  The patient was taken out of the Trendelenburg position.  She tolerated  the procedure well.  All sharps were disposed in proper accordance at  the completion of the procedure.  A STAT portable chest x-ray was  obtained which demonstrated good positioning of the catheter in the  superior vena cava without evidence of pneumothorax.      Tilford Pillar, MD  Electronically Signed  BZ/MEDQ  D:  01/14/2008  T:  01/15/2008  Job:  [id number]   cc:   Ramon Dredge L. Juanetta Gosling, M.D.  Fax: [phone]

## 2010-08-31 NOTE — Group Therapy Note (Signed)
[name], [name]                  ACCOUNT NO.:  [id number]   MEDICAL RECORD NO.:  [id number]          PATIENT TYPE:  INP   LOCATION:  A320                          FACILITY:  [facility]   PHYSICIAN:  Skeet Latch, DO    DATE OF BIRTH:  March 14, 1971   DATE OF PROCEDURE:  12/24/2006  DATE OF DISCHARGE:                                 PROGRESS NOTE   SUBJECTIVE:  Ms. [name] is a 40 year old who presented with bilateral  pneumonia with some associated on shortness of breath.  The patient also  was complaining of some cough that was persistent and states that it was  a dry cough.  The patient did admit to smoking crack cocaine and is  feeling worse.  Today, the patient states that her breathing is slightly  improved, denies any pain and her cough has also improved.   OBJECTIVE:  VITAL SIGNS:  Today, temperature is 98, pulse 72,  respirations 20, blood pressure 113/68.  The patient is saturating 90%  on room air.  CARDIOVASCULAR:  Regular rate and rhythm.  No rubs, gallops or murmurs.  LUNGS:  Coarse breath sounds heard bilaterally with wheezing.  No rales  or rhonchi.  ABDOMEN:  Slightly tender to palpation, generalized.  Positive bowel  sounds.  No rigidity or guarding.  EXTREMITIES:  No clubbing, cyanosis or edema.   LABORATORY DATA:  White count 7.9, hemoglobin 10.4, hematocrit 31.7,  platelet count 268,000.  Sodium 138, potassium 3.4, chloride is 104, CO2  26, glucose 196, BUN 11, creatinine 0.54, total bilirubin 0.2, albumin  is 2.8.   ASSESSMENT AND PLAN:  1. For pneumonia, the patient will be continued on IV Rocephin and      Zithromax at this time.  So far, blood cultures are negative.      Sputum culture is unremarkable also at this time.  2. For hypokalemia, we will replace potassium orally at this time and      get a repeat in the morning.  3. For history of polysubstance abuse, the patient will receive      counseling.  4. For tobacco abuse, the patient will be continued on  nicotine patch      daily and also counseling regarding tobacco cessation.  5. Lastly, I will get a repeat a chest x-ray in the morning.      Skeet Latch, DO  Electronically Signed     SM/MEDQ  D:  12/24/2006  T:  12/25/2006  Job:  [id number]

## 2010-08-31 NOTE — Group Therapy Note (Signed)
[name], [name]                  ACCOUNT NO.:  [id number]   MEDICAL RECORD NO.:  [id number]          PATIENT TYPE:  INP   LOCATION:  IC08                          FACILITY:  [facility]   PHYSICIAN:  Edward L. Juanetta Gosling, M.D.DATE OF BIRTH:  1970-06-19   DATE OF PROCEDURE:  DATE OF DISCHARGE:                                 PROGRESS NOTE   Ms. [name] is continued to be somewhat unstable who has had difficulty  with blood pressure because of putting on the medications to help with  her sedation.  She now has blood pressure of about 70 systolic.  She  received over a liter of IV fluids.  Sepsis protocol has been initiated.  Initially, she was not sedated enough per Dr. Leticia Penna to safely place a  central venous line, he now I think can do so.  Her blood gas shows pO2  of 229, pCO2 of 45, and pH 7.37.   ASSESSMENT:  I think she is a bit better.   PLAN:  I am going to go ahead and cut her FiO2.  She is on 100% now.  We  will cut it to 50%.  The central line waiting for transfer.  We are  going to continue with fluids, etc.  She is started on phenylephrine.      Edward L. Juanetta Gosling, M.D.  Electronically Signed     ELH/MEDQ  D:  01/11/2008  T:  01/12/2008  Job:  [id number]

## 2010-08-31 NOTE — H&P (Signed)
[name], [name]                  ACCOUNT NO.:  [id number]   MEDICAL RECORD NO.:  [id number]          PATIENT TYPE:  INP   LOCATION:  A320                          FACILITY:  [facility]   PHYSICIAN:  Dorris Singh, DO    DATE OF BIRTH:  1970/04/21   DATE OF ADMISSION:  12/23/2006  DATE OF DISCHARGE:  LH                              HISTORY & PHYSICAL   The patient is a 40 year old Caucasian female who was admitted to the  service of [facility] here at [facility] after the examined by the  emergency room and diagnosed with pneumonia.  The patient states that  for the last 4 days she had had increasing shortness of breath and  fatigue and could not manage to do her activities of daily living and  she had her sister come [place] her from the beach and her sister  convinced her that since she had been 7 months clean from cocaine use  that it probably would make her feel better if she had a couple his of  cocaine and she went ahead and did that and it actually made her feel  worse.  Currently seen in the room, the patient still has the effects of  cocaine in her system but she is able to answer questions appropriately.   PAST MEDICAL HISTORY:  Is significant for asthma as well as surgical  history of two C sections.   SOCIAL HISTORY:  She has a history of crack abuse claimed to be sober or  clean for 7 months, lives with her six children and is a non drinker,  smokes about a pack a day.  The patient states she does not have any  periods at this point in time.   MEDICATIONS:  She currently does not have any.   ALLERGIES TO MEDICATIONS:  She has an allergy to PENICILLIN, no reaction  given.   REVIEW OF SYSTEMS:  Positive for fatigue, shortness of breath, dyspnea,  wheezing and cough.   PHYSICAL EXAMINATION:  VITALS are as follows:  Temperature 98.6, pulse  113, respirations 22 and her blood pressure is 120/78.  GENERAL: This is a 40 year old Caucasian female who is thin, well-  developed,  well-nourished in no acute distress, looks older than stated  age.  SKIN:  Good tigor, good texture.  No tattoos or scarring noted.  HEAD: Normocephalic, atraumatic.  Mouth and throat:  Teeth are in poor  repair with missing teeth and the patient constantly is doing  fasciculations with her tongue during the interview.  Pupils are  nonreactive to light but there is no conjunctival injection or scleral  icterus.  Ears are symmetrical.  No discharge noted.  Gross auditory  acuity intact.  NECK: No masses.  Full range of motion.  No spine or tracheal deviation.  HEART:  Regular rate and rhythm.  No murmurs, rubs or clicks.  LUNGS: Clear to auscultation bilaterally.  No wheezes, rales or rhonchi.  ABDOMEN: Soft, nontender, nondistended.  No masses, guarding or CVA  tenderness.  MUSCULOSKELETAL:  No muscular atrophy, weakness and full  joint range  of motion, no redness, swelling or tenderness  NEUROLOGIC:  Cranial nerves II-XII grossly intact.   LABS:  White count of 10.4, hemoglobin 11.0, hematocrit 32.9 and  platelet count 284.  The patient had a chest x-ray that showed by  bibasilar infiltrates.  D dimer done that showed to be .52 which is  elevated and it was then determined that the patient to be and admitted  to the service [facility].   The patient does not have a primary care physician in the area.  Her  diagnosis is pneumonia and a history of crack cocaine abuse.   PLAN:  Will admit the patient to 3A and start the patient on antibiotic  therapy as well as IV therapy and steroids.  Will continue to monitor  her and put her on handheld nebulizers on a regular schedule.  Will  continue to monitor the CBCs and sputum cultures as well and blood  cultures to determine if antibiotics are the correct antibiotics to  cover her infection. Plan on repeating a chest x-ray on day 2 to  determine if the pneumonia is getting more.      Dorris Singh, DO  Electronically Signed      CB/MEDQ  D:  12/24/2006  T:  12/24/2006  Job:  [id number]

## 2010-08-31 NOTE — Discharge Summary (Signed)
NAMEZILDA, NO                  ACCOUNT NO.:  [id number]   MEDICAL RECORD NO.:  [id number]          PATIENT TYPE:  INP   LOCATION:  6708                         FACILITY:  [facility]   PHYSICIAN:  Leslye Peer, MD    DATE OF BIRTH:  07/30/1970   DATE OF ADMISSION:  01/11/2008  DATE OF DISCHARGE:  01/28/2008                               DISCHARGE SUMMARY   DISCHARGE DIAGNOSES:  1. Hydropneumothorax on the left with necrotizing methicillin-      sensitive Staphylococcus aureus pneumonia with chest tube placement      x2.  2. Anemia.   HISTORY OF PRESENT ILLNESS:  Ms. Ryther is a 40 year old white female who  was transferred from [facility] to [facility] on  __________, 2009.  She is a 40 year old with a past medical history  significant for chronic obstructive pulmonary disease and cocaine use,  multiple hospitalizations for pneumonia.  She was admitted on September  20 to [facility] for pneumonia and COPD exacerbation.  She was  discharged on prednisone and Levaquin.  She was transferred on today,  September 25, from [facility] for septic shock and pneumonia  with dense consolidation of the right upper lobe and left lower lobe  and, per family, she had a cat bite on the right ear.  Initially  reported that he tested positive for rabies.  She had rabies  immunoglobulin.  CT of the neck showed compatible severe cellulitis with  __________ with __________.  Blood cultures September 25 were positive  for gram-positive cocci.  Further culture data:  Initial antibiotics  consisted of Primaxin, vancomycin and Fortaz.   LABORATORY DATA:  Hemoglobin 9.5, hematocrit 28.9.  Sodium 135,  potassium 3.8, chloride 100, CO2 is 28, BUN is 9, creatinine 0.42,  glucose 132, calcium is 8.4.  Pleural fluid demonstrates no growth.  AFB  cultures have no acid-fast bacteria at this time.  Urine culture shows  moderate Staph aureus.  BAL demonstrates  methicillin-sensitive  Staphylococcus aureus.   RADIOGRAPHIC DATA:  Chest x-ray October 12 demonstrates the large left-  sided chest tube has been removed, stable left pleural effusion with  peripheral linear opacities, scattered airspace disease.   HOSPITAL COURSE BY DISCHARGE DIAGNOSIS:  1. Left hydropneumothorax with necrotizing methicillin-sensitive      Staphylococcus aureus pneumonia.  She had chest tubes placed x2      with one large-bore 28-French and one 20-French placed by      interventional radiology, which has now been changed to a Mini-      Express 500, and on which she will be discharged home.  She has a      pigtail catheter in place, which is a __________ catheter.  This      will be followed up by interventional radiology within 1 week.  She      will continue with the catheter in place with the Mini-Express      drainage.  She is currently on day 16 of 28 days of antimicrobial      therapy with Ancef.  She had an ID consult with Dr. Sampson Goon with      those recommendations.  She will also follow up with Dr. Levy Pupa along with a chest x-ray.  2. Anemia.  This will be monitored on an outpatient basis.   DISCHARGE MEDICATIONS:  She will be on Ancef 2 g IV every 8 hours by a  right percutaneous PICC line that was placed on January 27, 2008.  She  also has Tylenol 650 mg every 4 hours as needed for pain.   Care of the chest tube she has been instructed in.  She will have a Mini-  Express drainage and keep a daily record of the drainage.  She will  follow up with interventional radiology in 1 week to monitor her  drainage output.  She will  follow up with Dr. Delton Coombes on October 26 at 1:30 p.m. with a chest x-ray.  Her diet is as instructed.  Chest tube care as instructed.   DISPOSITION/CONDITION ON DISCHARGE:  Improved.      Devra Dopp, MSN, ACNP      Leslye Peer, MD  Electronically Signed    SM/MEDQ  D:  01/28/2008  T:  01/28/2008  Job:   [id number]   cc:   Art A. Hoss, M.D.  Lacretia Leigh. Ninetta Lights, M.D.

## 2010-08-31 NOTE — Group Therapy Note (Signed)
NAMEDANIQUE, HARTSOUGH                  ACCOUNT NO.:  [id number]   MEDICAL RECORD NO.:  [id number]         PATIENT TYPE:  PINP   LOCATION:  IC08                          FACILITY:  [facility]   PHYSICIAN:  Edward L. Juanetta Gosling, M.D.DATE OF BIRTH:  March 16, 1971   DATE OF PROCEDURE:  DATE OF DISCHARGE:                                 PROGRESS NOTE   Ms. Zartman now has a central line placed by Dr. Leticia Penna.  We do not have  a CVP yet.  Chest x-ray is pending, but it was placed with no  difficulty.  She is now on phenylephrine.   Her blood pressures in the 80s, pulse rate is in the 100s.  Her blood  gas shows her pO2 was greater than 200s, so we can cut her back to 50%  FiO2, and she is much more stable.  Being able to cut back on her  propofol and blood pressure is coming up, the phenylephrine is keeping  her blood pressure up as well.   Additional history now is that some time ago it was not quite clear  exactly when this happened.  She was bit on her right ear by a cat.  The  cat has since been known to have rabies and has been euthanized.  Of  course, she may have various infections from the cat bite, but also she  is going to have to receive the rabies series, which I am going to go  ahead and initiate.      Edward L. Juanetta Gosling, M.D.  Electronically Signed     ELH/MEDQ  D:  01/11/2008  T:  01/12/2008  Job:  [id number]

## 2010-08-31 NOTE — Consult Note (Signed)
[name], CURVIN                  ACCOUNT NO.:  [id number]   MEDICAL RECORD NO.:  [id number]          PATIENT TYPE:  INP   LOCATION:  IC08                          FACILITY:  [facility]   PHYSICIAN:  Edward L. Juanetta Gosling, M.D.DATE OF BIRTH:  11-Nov-1970   DATE OF CONSULTATION:  DATE OF DISCHARGE:                                 CONSULTATION   Ms. Parslow is a 40 year old who had been hospitalized here the first time  December 12, 2007 until the December 14, 2007.  Then she came back on  January 06, 2008, was hospitalized but discharged on January 09, 2008, I believe, and returned on January 11, 2008.  When she came in  this morning she was more short of breath.  She has had 3 episodes of  pneumonia apparently in the last 3 years.  She was hospitalized, as  mentioned, from December 12, 2007 to December 14, 2007 came back on  January 06, 2008 and was discharged after 2 days.  She said that it  was her birthday and she wanted to go home.  After she got home, she  became more short of breath, started coughing up blood, had brown  sputum.  She was hot but did not check her temperature.  She has been  losing weight.  She has been using a great deal of crack cocaine and  actually did that after she was discharged from the hospital.  She was  to be on Levaquin, prednisone, Vicodin.  I am not sure she actually  filled these prescriptions when she left.   Her past medical history is positive for asthma, pneumonia.  She says  that she does not have HIV as far as she knows.  She has had C-sections  and she has the problem with use of cocaine.  According to her  significant other, who is with her in the room, he thinks that she has  been using this week.   SOCIAL HISTORY:  She has 6 children.  Lives at home with her children.  She smokes about 2 packs of cigarettes daily.  She does not use any  alcohol.  She has been using cocaine.   Her family history is positive for colon cancer, diabetes.   Her  review of systems, except as mentioned, is essentially negative.  She is very anxious.  She has been weak.  She has been coughing.   Temperature on admission was 98.8, blood pressure 125/83, heart rate was  105, respirations 24, O2 sat was in the 80s and her O2 sat then went up  to 95%.  I saw her initially in the emergency room, which revealed a very thin,  tachypneic female who was anxious and complaining of pain in her chest  when she took a breath.  She kept asking if she was going to die.  Her pupils are reactive.  Mucous membranes are fairly moist.  Her nose  and throat were clear.  She had swelling around her right ear with some  inflammation as well.  It appeared to be that she  had an abscess in that  area.  She did not have thyromegaly.  Her chest showed that she was markedly tachypneic.  She had rhonchi  bilaterally, crackles in the right upper lobe area.  Her heart was regular with initially a mild tachycardia.  No S3, no S4.  Her abdomen was soft.  I did not feel any masses.  Bowel sounds were  present.  Extremities showed no swelling.  Pulses were palpable.  Her neurological examination, she was alert and oriented and  communicative.   LABORATORY WORK:  Her white count was 28,100, 94% neutrophils, 20%  bands.  Hemoglobin 10.8, platelets 418, potassium was low at 314.  Blood  cultures pending.  Chest x-ray showed significant worsening of bilateral  pulmonary infiltrates.  They were somewhat nodular and there was some  question about septic emboli.  Since she has been admitted, she was  admitted to the intensive care unit.  She has become progressively more  dyspneic.  She became hypotensive, with a blood pressure into the 70s.  She became progressively anxious.  It became clear that she was not  going to be able to sustain her respirations.  She was intubated after  rapid sequence intubation with etomidate 10 mg IV and 100 mg of  succinylcholine, 4 mg of Versed.   Intubation was done on the first  attempt with a #8 endotracheal tube with no difficulty.  Her O2 sat,  which had been in the 70s, promptly came up to 100%.  Her blood  pressure, which had been in the 70s, came up into the 110-120 range.  Heart rate has still been high at about 150-180.  It looks like a sinus  tachycardia.  She has been given a bolus of IV fluids and she is going  to be started on Versed for sedation.  She may need something for her  heart rate as well.  Her post intubation chest x-ray is pending.  I have  discussed all of this with her family.  I have discussed her situation  with Dr. Sena Slate, intensivist at [facility], and he agrees to accept  her in transfer.  Clearly she is having more and more problems.  She has  currently been on Cleocin 300 mg IV every 6 hours, Lovenox, prophylactic  dose Levaquin 750 mg every 24 hours, Zosyn 3.375 mg per pharmacy,  vancomycin per pharmacy.  She is clearly critically ill.  I have kept  her family up to date with the situation.  She is on Protonix.  I am not  certain if with her multiple drug abuse, if the use of Versed is going  to be enough to control her anxiety.  If not, she will require propofol.   TOTAL ICU TIME:  So far 1 hour.      Edward L. Juanetta Gosling, M.D.  Electronically Signed     ELH/MEDQ  D:  01/11/2008  T:  01/11/2008  Job:  [id number]

## 2010-08-31 NOTE — H&P (Signed)
[name], [name]                  ACCOUNT NO.:  [id number]   MEDICAL RECORD NO.:  [id number]          PATIENT TYPE:  EMS   LOCATION:  ED                            FACILITY:  [facility]   PHYSICIAN:  Osvaldo Shipper, MD     DATE OF BIRTH:  [birth year]   DATE OF ADMISSION:  01/11/2008  DATE OF DISCHARGE:  LH                              HISTORY & PHYSICAL   Patient does not have a primary medical doctor.   ADMITTING DIAGNOSIS:  1. Bilateral pneumonia, possible methicillin-resistant Staphylococcus      aureus, rule out AFB.  2. Right neck inflammation, rule out abscess.  3. Right thigh pain, rule out deep venous thrombosis.  4. Body aches.   CHIEF COMPLAINT:  Shortness of breath since the last four days.   HISTORY OF PRESENT ILLNESS:  Patient is a 40 year old Caucasian female  who has had at least three episodes of pneumonia over the past few  years.  She was admitted initially for a couple of days, from August 26  to August 28 of this year.  She came back to the hospital September 20  and was discharged after two days.  Patient mentions that she did not  feel well at the time of discharge.  She got worse when she went home.  She became more and more short of breath, started coughing up specks of  blood with brownish sputum, felt hot, but did not check her temperature.  Did have episodes of vomiting, as well.  She admits to losing about 15  pounds over the last one month because of poor oral intake.  Denies any  night sweats.  Denies any sick contact.  Denies any recent travel  outside this area.  She is also complaining of pain around her right ear  and neck area, which has also been swelling up and causing severe pain.  She is also complaining of generalized body aches.  Patient is also  complaining of right thigh pain and she thinks that she may have noticed  some swelling, but denies any redness. Apparently patient insisted that  she be discharged on Sept 23 as it was her  birthday.   MEDICATIONS AT HOME:  She was discharged on Levaquin 500 mg, prednisone  taper, Vicodin, and she has been trying over-the-counter Tylenol and  ibuprofen.   ALLERGIES:  Include CECLOR and PENICILLIN.  Penicillin causes  anaphylaxis, actually angioedema.   PAST MEDICAL HISTORY:  Positive for asthma, episodes of pneumonia in the  past.  She has had C-sections in the past. Denies any HIV infection,  denies any risk factors for HIV, though she has been a drug addict.   SOCIAL HISTORY:  Lives in [place] with her children.  She has six  children.  Smokes two packs of cigarettes on a daily basis.  Denies any  alcohol use.  She says she has not done any cocaine in a month.   FAMILY HISTORY:  Colon cancer, diabetes.   REVIEW OF SYSTEMS:  GENERAL REVIEW OF SYSTEMS:  Positive for weakness,  malaise, anxiety.  CARDIOVASCULAR:  Unremarkable.  RESPIRATORY:  As in  HPI.  GI:  Except as in HPI, unremarkable.  GU:  Unremarkable.  HEENT:  Unremarkable.  MUSCULOSKELETAL:  As in HPI.  NEUROLOGICAL:  Unremarkable.  PSYCHIATRIC:  Unremarkable.  OTHER SYSTEMS:  Unremarkable.   PHYSICAL EXAM:  VITAL SIGNS:  Temperature is recorded at 98.8, blood  pressure 125/83, heart rate 105, respiratory rate 24, saturation  initially was in the mid-80s, according to the ED physician, improving  to 95 on 4 L per nasal cannula.  GENERAL EXAM:  A thin, white female, who is tachypneic, but in no  distress at this time.  She is also very anxious.  HEENT:  There is no pallor, no icterus.  Her mucous membranes are moist.  No erythema noticed in the pharyngeal area.  She does have significant  inflammation and infection of her right ear.  There is also significant  swelling around the ear, which extends down to the neck area.  The whole  area is swollen, red, tender to palpation, warm to touch.  There is a  possible abscess and pus collection in that area.  No thyromegaly is  appreciated.  LUNGS:  Reveal  rhonchi bilaterally, and coarse crackles present in the  right upper lobe.  CARDIOVASCULAR:  S1, S2, tachycardic, regular.  I do not appreciate any  murmurs.  No S3, S3, no rubs, no bruits are heard.  Peripheral pulses  are palpable.  ABDOMEN:  Soft, nontender, nondistended.  Bowel sounds are present.  No  mass or organomegaly is appreciated.  EXTREMITIES:  Right thigh area was examined, slightly tender to  palpation, no obvious swelling is noted.  Peripheral pulses are  palpable.  NEUROLOGICALLY:  She is alert, oriented times three, no focal  neurological deficits are present.   LABORATORY DATA:  Her white count is 28,100 with 94% neutrophils, more  than 20% bands reported.  Hemoglobin is 10.8, platelet count is 418.  Her potassium is 3.4.  LFTs are not checked.  Blood cultures are  pending.  She had a chest x-ray, which revealed significant worsening  and bilateral pulmonary infiltrates with observed areas of opacity,  nodular in appearance.  Septic embolization was suspected.   ASSESSMENT:  This is a 40 year old Caucasian female who has a history of  cocaine abuse, who also has a history of pneumonias in the past, who  presents with worsening shortness of breath.  She was discharged after a  two-day stay, just a few days ago.  Patient probably has had incomplete  resolution of her pneumonia.  She may have had incomplete treatment of  her pneumonia, as well.  Pneumonia is highly suspicious for MRSA.  Considering her weight-loss, her drug abuse history, & smoking history,  Koch's infection, that is tuberculosis, should also be considered in the  differential.  She also merits an HIV test, as well.   PLAN:  1. Bilateral pneumonia.  We will admit her to the hospital with      vancomycin, Levaquin and clindamycin.  I have spoken to Dr. Juanetta Gosling      and he agrees with that combination.  I will observe her in the      intensive care unit on the negative pressure respiratory isolation.       Sputum for AFB will be checked.  PPD will be sent off.  HIV testing      will be done.  Urine drug screen will be repeated.  Sputum cultures  will also be sent.  2. Right neck pain.  Very concerning for infection/abscess.  Since we      do not have ENT, we need to do a CT of her neck to make sure there      is no abscess.  I have explained this to the ED physician and he      agrees that, if there is an abscess, the patient will need to be      sent down to either [facility] or another tertiary facility.      Clindamycin should cover any anaerobic organisms associated with      this inflammation.  3. Right thigh pain.  Considering recent hospitalization, high risk      for DVT.  Hence, venous Dopplers will be done.  4. Cocaine abuse.  She tells me she has not had any in the last one      month.  A urine drug screen will be repeated.  5. Sepsis with dehydration.  Intravenous fluids will be given.  A      Foley catheter to check urine also will be placed,      as well.  6. Anemia, which is stable, actually.  We will send off anemia panel,      if it has not been done recently.   Further management and decisions will depend on the results of further  testing and patient's response to treatment.      Osvaldo Shipper, MD  Electronically Signed     GK/MEDQ  D:  01/11/2008  T:  01/11/2008  Job:  [id number]   cc:   Ramon Dredge L. Juanetta Gosling, M.D.  Fax: [phone]

## 2010-08-31 NOTE — Group Therapy Note (Signed)
NAMERENETA, [name]                  ACCOUNT NO.:  [id number]   MEDICAL RECORD NO.:  [id number]          PATIENT TYPE:  INP   LOCATION:  A320                          FACILITY:  [facility]   PHYSICIAN:  Dorris Singh, DO    DATE OF BIRTH:  1970-08-17   DATE OF PROCEDURE:  12/25/2006  DATE OF DISCHARGE:                                 PROGRESS NOTE   Patient seen resting in bed today stating that she does not feel better.  She states that she feels more fatigued; and is concerned about her  discharge time plan because she has 6 kids; and is concerned about how  well she will do.  I explained to the patient that she has had her chest  x-ray today; we will see where she is; and anticipate discharge in 1-2  days depending on her progress with the current antibiotic regimen.  Still is on Solu-Medrol and breathing treatments around-the-clock.   OBJECTIVE:  VITAL SIGNS:  Her vitals, today, 97.7, pulse 75,  respirations 20, blood pressure 133/83.  GENERAL:  The patient is a 40-  year-old Caucasian female who is well-developed, well-nourished in no  acute distress.  HEART:  Regular rate and rhythm.  No murmurs, rubs or gallops.  LUNGS:  Bilateral wheezes and rales in all lung fields.  ABDOMEN:  Soft, nontender, nondistended.  EXTREMITIES:  Positive pulses in all four quadrants.  No ecchymosis,  cyanosis or edema.   Her white count today is 11.6 which is elevated from yesterday which was  7.9; hemoglobin is 9.9; and hematocrit is 30.  Her platelet count is  296.  Her sodium 144, potassium 2.4, chloride 109, CO2 29, glucose 185,  BUN 9, AND creatinine is 0.48.   ASSESSMENT/PLAN:  1. Community-acquired pneumonia.  2. Elevated hyperglycemia  3. Hypokalemia.  4. Cocaine abuse.   PLAN:  We will continue with current therapy of steroids, IV fluids,  expectorants, and handheld nebulizers.  We will do a hemoglobin A1c to  see if the patient has a history of diabetes based on current blood  sugars.   However, she is on steroids so anticipate her blood sugars to  be elevated to a degree.  May consider instituting coverage while she is  here.  Will also replace her potassium and continue to watch her blood  sugars as well as looking at her chest x-ray when it becomes available.     Dorris Singh, DO  Electronically Signed    CB/MEDQ  D:  12/25/2006  T:  12/25/2006  Job:  [id number]

## 2010-08-31 NOTE — H&P (Signed)
NAMESARHA, [name]                  ACCOUNT NO.:  [id number]   MEDICAL RECORD NO.:  [id number]          PATIENT TYPE:  INP   LOCATION:  A340                          FACILITY:  [facility]   PHYSICIAN:  Margaretmary Dys, M.D.DATE OF BIRTH:  1971/04/09   DATE OF ADMISSION:  01/06/2008  DATE OF DISCHARGE:  LH                              HISTORY & PHYSICAL   PRIMARY CARE PHYSICIAN:  Unassigned.   ADMITTING DIAGNOSES:  1. Bilateral pneumonia.  2. Acute exacerbation of asthma.  3. Poor compliance with asthma medications.  4. History of substance abuse with cocaine.   CHIEF COMPLAINT:  Increasing shortness of breath.   HISTORY OF PRESENT ILLNESS:  Ms. [name] is a 40 year old female who  presents to the emergency room with increased shortness of breath,  wheezing and cough.  She also reports pain in her left ribs.  She  describes the pain as a pleuritic chest pain.  Patient's cough is  productive of brownish sputum.  Patient says symptoms have been  worsening over the last 24-48 hours.  She describes generalized  difficulty with breathing, chest tightness and congestion.  Patient was  evaluated in the emergency room and was found to be having acute asthma  attack in addition to having bilateral pneumonia.  Patient is now being  admitted for further evaluation and management.  Patient was recently in  the hospital on December 12, 2007, and patient signed out AMA despite not  being medically ready for discharge at the time.   Patient was sent home on Medrol Dosepak.   Patient reports fevers with chills.  She reports no hemoptysis.  She  reports some nausea with vomiting.   REVIEW OF SYSTEMS:  A 10-point review of systems is otherwise negative  except as mentioned in history of present illness.   PAST MEDICAL HISTORY:  Asthma with poor control.  Patient may have a  COPD component too.   MEDICATIONS:  Albuterol nebulizers as needed.  She is very poorly  compliant.   ALLERGIES:   PENICILLIN.   PAST SURGICAL HISTORY:  D and C and hysterectomy in the past.   SOCIAL HISTORY:  Patient lives in [place].  She has 6 children.  She  is separated.  She is currently unemployed.  She smokes 2 packs of  cigarettes a day and uses cocaine.  Patient was able to stay clean for  about 8 months but relapsed last month.  She denies any alcohol abuse.  Patient is independent with activities of daily living.   FAMILY HISTORY:  Father died of colon cancer.  Mother has heart disease  and diabetes.  Siblings are well except for some psychiatric issues.   PHYSICAL EXAMINATION:  GENERAL:  Patient was conscious, alert, in  moderate respiratory distress.  VITAL SIGNS:  On arrival in the emergency room revealed blood pressure  of 105/63 with a pulse of 125, respiratory rate of 28, temperature of  99.3 degrees F.  Oxygen saturation was 93% on room air.  HEENT:  Normocephalic, atraumatic.  Oral mucosa moist with no exudates.  Patient was somewhat wasted.  NECK:  Supple, no JVD or lymphadenopathy.  LUNGS:  Markedly reduced air entry bilaterally with bilateral diffuse  wheezing and rhonchi heard.  HEART:  S1, S2, tachycardic, no S3, S4, gallops or rubs.  ABDOMEN:  Soft, nontender, bowel sounds positive, no masses palpable.  EXTREMITIES:  No pitting pedal edema, no calf induration or tenderness  noted.  CNS:  Grossly intact with no focal neurological deficits.   LABORATORY/DIAGNOSTIC DATA:  White blood cell count was 11.6, hemoglobin  12.2, hematocrit 37.4, platelet count 301 with 87% neutrophils.   Sodium 134, potassium 3.5, chloride of 101, CO2 of 28, glucose 129, BUN  11, creatinine 0.71, calcium 8.4.   Chest x-ray shows bilateral infiltrates suggestive of marked apical  pneumonia.  There was also evidence of bronchitis and pulmonary  hyperinflation.   ASSESSMENT/PLAN:  This is a 40 year old lady poorly compliant who has  lifelong asthma now presenting with an acute exacerbation,  likely  secondary to bilateral pneumonia.  Patient will be admitted to the  hospital.  1. Admit to the medical floor under the care of the hospitalists.  2. Will initiate intravenous antibiotics with Levaquin to cover for      typical and atypical organisms at 750 mg intravenously once a day.      If patient's symptoms do not improve, she may need the addition of      vancomycin as she was recently in the hospital only about 3 weeks      ago.  3. We will continue on Solu-Medrol at 125 mg intravenously q.6 h.  4. We will place patient on albuterol and Atrovent nebulizers.  5. Discussed smoking cessation with her and avoidance of illicit drug      use such as cocaine which may be precipitating some of her attacks.  6. I will have social services discuss with her that perhaps she might      need some help.  7. Deep venous thrombosis prophylaxis will be with Lovenox and      gastrointestinal prophylaxis with Protonix.  8. I have discussed the above plan with the patient who voiced her      understanding.  I will also start her on some intravenous fluids.      Margaretmary Dys, M.D.  Electronically Signed     AM/MEDQ  D:  01/06/2008  T:  01/06/2008  Job:  [id number]

## 2010-08-31 NOTE — Group Therapy Note (Signed)
[name], [name]                  ACCOUNT NO.:  [id number]   MEDICAL RECORD NO.:  [id number]          PATIENT TYPE:  INP   LOCATION:  A301                          FACILITY:  [facility]   PHYSICIAN:  Dorris Singh, DO    DATE OF BIRTH:  10/23/1970   DATE OF PROCEDURE:  12/14/2008  DATE OF DISCHARGE:                                 PROGRESS NOTE   The patient was seen today and states that she has been having some left  shoulder pain.  She has noted that she has been using her left arm more  due to her right arm having a DVT in it and the right arm is hurting her  more at night.  I explained to her this is probably due to overuse  syndrome and will go ahead and order her some Tylenol which may help  with the possible myalgias and arthralgias that she could be  experiencing.  Also her current blood work shows she is not therapeutic  however pharmacy is dosing her for full anticoagulation.   PHYSICAL EXAMINATION:  VITAL SIGNS:  Temperature 98.9, pulse 101,  respirations 18, blood pressure 97/58.  GENERAL:  She is a well-developed and well-nourished, in no acute  distress.  HEART:  Regular rate and rhythm.  LUNGS:  Positive inspiratory and expiratory wheezing.  ABDOMEN: Soft and nontender and nondistended.  EXTREMITIES:  Positive __________ no ecchymosis or cyanosis.  The right  lower extremity with some swelling.   CURRENT LABS:  White count 6.7, hemoglobin 10.5, hematocrit __________,  platelet count 240.  Her current INR is 1.1.  Sodium 136, potassium 4.1,  chloride 102, CO2 of 28, glucose 131, BUN 3, and creatinine 0.42.   ASSESSMENT AND PLAN:  1. Right lower extremity deep vein thrombosis.  2. Chronic obstructive pulmonary disease.   Plan will be to continue with full anticoagulation.  Will get the  patient more ambulatory today.  Will also elevate limb and keep cold  compresses on it.  For her COPD will continue with breathing treatments  and put her on a patch. For her left  shoulder pain will recommend  Tylenol __________ as directed.  Will see if she qualifies for home  health care if she can get her INR checked at home so we can maybe  arrange for her to have Lovenox and Coumadin monitoring at home. This  will happen on Monday.  Will continue to monitor the patient and change  therapy as necessary.      Dorris Singh, DO     CB/MEDQ  D:  12/14/2008  T:  12/14/2008  Job:  [id number]

## 2010-08-31 NOTE — H&P (Signed)
[name], [name]                  ACCOUNT NO.:  [id number]   MEDICAL RECORD NO.:  [id number]          PATIENT TYPE:  INP   LOCATION:  A339                          FACILITY:  [facility]   PHYSICIAN:  Osvaldo Shipper, MD     DATE OF BIRTH:  02/21/1971   DATE OF ADMISSION:  12/12/2007  DATE OF DISCHARGE:  LH                              HISTORY & PHYSICAL   The patient does not have a PMD.   ADMISSION DIAGNOSES:  1. Shortness of breath, possible asthma.  2. History of recent cocaine use.  3. History of recent road trip.  4. Tobacco abuse.   CHIEF COMPLAINT:  Shortness of breath and cough for the past three days.   HISTORY OF PRESENT ILLNESS:  The patient is a 40 year old Caucasian  female who was in her usual state of health about four days ago when she  used crack cocaine.  She said that after that she started feeling more  and more short of breath.  She was apparently wheezing.  She was  coughing with yellowish-brownish expectoration.  She had a fever of 102  degrees Fahrenheit.  She had the bilateral sharp chest pains which were  increasing with deep breathing and cough.  She denies any sick contacts;  however, she had been traveling quite a bit over this past summer.  Her  last travel was a week ago to [place] which is a 4-hour drive which she  did in one sitting.  She had mentioned that she is feeling a little bit  agitated at this time but she is unable to explain it to me.   She states that ever since she has received some breathing treatments  she is feeling better.   MEDICATIONS AT HOME:  Over the counter mist, otherwise nothing on a  scheduled basis.   ALLERGIES:  PENICILLIN which causes rash and angioedema.   PAST MEDICAL HISTORY:  Positive for asthma diagnosed 20 years ago.   PAST SURGICAL HISTORY:  D&C many years ago.   Past medical history otherwise does not include any coronary artery  disease or any other medical issues.   SOCIAL HISTORY:  Lives in [place]  with the children, currently  unemployed.  She smokes two packs of cigarettes on a daily basis.   HABITS:  Drug use:  She says she has quit eight months ago from her  cocaine; however, she relapsed four days ago as discussed earlier.  She  denies any IV drug use.  No alcohol use.  Independent with her daily  activities.   FAMILY HISTORY:  Father died of colon cancer.  Mother has heart disease  and diabetes.  Siblings are well except for some psychiatric issues.   REVIEW OF SYSTEMS:  HEENT:  Unremarkable.  CARDIOVASCULAR:  Unremarkable.  RESPIRATORY:  As in HPI.  GASTROINTESTINAL:  Unremarkable.  GENITOURINARY:  Unremarkable.  UROLOGIC:  Unremarkable.  PSYCHIATRIC:  Unremarkable.  NEUROLOGIC:  Unremarkable.  Other systems  unremarkable.  GENERAL:  Positive for some anxiety.   PHYSICAL EXAMINATION:  VITAL SIGNS:  When she was in the ED  she was  found to have a temperature of 99.6, blood pressure 142/107, heart rate  103, respiratory rate 30.  Saturation was 86% on room air, improving to  97% on 2 L by nasal cannula.  GENERAL:  The patient is a tanned white female, very anxious, slightly  agitated but in no distress.  HEENT:  There is no pallor, no icterus.  Oral mucous membranes slightly  dry.  No oral lesions.  ENT exam was otherwise unremarkable.  NECK:  Soft and supple.  No thyromegaly is appreciated.  LUNGS:  Clear to auscultation.  No wheezing, rales, or rhonchi.  CARDIOVASCULAR:  S1/S2, tachycardic, regular.  No murmurs appreciated.  No S3 or S4, no rubs, no bruits.  No JVD is noted.  EXTREMITIES:  No extremity edema is present.  No erythema.  No calf  tenderness.  Pulses are present bilaterally  ABDOMEN:  Soft.  There is some nonspecific tenderness in the bilateral  upper quadrant.  No rebound, rigidity, or guarding is present.  Bowel  sounds are present.  No mass or organomegaly is appreciated.  NEUROLOGIC:  She is alert, oriented x3.  No focal neurologic deficits  are  present.   LABORATORY DATA:  Her total white count is normal at 9.2, hemoglobin is  11, MCV is 91, platelet count is 360.  Differential  is normal.  Her  potassium is 3.1, glucose is 140.  LFTs have not been done.   She had a chest x-ray which showed stable changes of COPD versus asthma  with no other acute abnormalities that were found.   ASSESSMENT:  This is a 40 year old Caucasian female who has history of  asthma since the age of 40 who presents with a 3-day history of  progressively worsening shortness of breath with cough, some yellowish  expectoration.  She mentions a history of fever as well.  She also had a  recent road trip and cocaine use.  She is tachypneic, tachycardic as  well.  She was also hypoxic on presentation.  She also has a significant  tobacco abuse and possibly has a component of COPD in addition to her  asthma.   PLAN:  1. Shortness of breath.  It is quite possible that with nebulizer      treatments her wheezing has completely resolved.  She still seems      short of breath, however.  She does have this chest pain which      sounds to be pleuritic in nature which could be secondary to her      bronchitic picture.  Because of her recent road trip and chest pain      and tachycardia and hypoxia, thromboembolism certainly needs to be      entertained.  So we will go ahead and check an ABG.  Check a D-      dimer.  We will start the patient on some antibiotics because of      the history of fever.  We will change her albuterol over to Xopenex      because of her tachycardia.  Will check an EKG for tachycardia as      well. Will continue with the steroids and nebulizer treatments.  If      the patient does not improve consultation with pulmonary may have      to be considered.  2. Hypokalemia.  Potassium will be replaced.  Magnesium level will be      checked.  3. Vague abdominal  discomfort.  Will check LFTs and then proceed from      there.  I think the pain  is more likely because of her pulmonary      process rather than a de novo abdominal process but we will closely      monitor her.  4. Tachycardia likely because of nebulizer treatments and her      respiratory discomfort.  ABG will be checked.  5. Drug abuse with cocaine.  Urine drug screen will be checked.  I      have counseled her on her cocaine use.  6. Tobacco abuse.  Nicotine patch will be prescribed.  I have told her      that quitting tobacco will probably help the most with respect to      her pulmonary disease.   Further management decisions will depend on results of further testing  and the patient's response to treatment.      Osvaldo Shipper, MD  Electronically Signed     GK/MEDQ  D:  12/12/2007  T:  12/12/2007  Job:  [id number]

## 2010-08-31 NOTE — Group Therapy Note (Signed)
NAMESACORA, HAWBAKER                  ACCOUNT NO.:  [id number]   MEDICAL RECORD NO.:  [id number]          PATIENT TYPE:  INP   LOCATION:  IC08                          FACILITY:  [facility]   PHYSICIAN:  Edward L. Juanetta Gosling, M.D.DATE OF BIRTH:  Jul 15, 1970   DATE OF PROCEDURE:  01/11/2008  DATE OF DISCHARGE:                                 PROGRESS NOTE   Ms. Arrowood was intubated, but we have had a very difficult time balancing  sedation versus blood pressure.  She has received a bolus of 1 liter of  normal saline.  She is going to receive another.  We started off on  Versed which did not work.  She then had Diprivan.  The Diprivan dropped  her blood pressure.  We backed off on the Diprivan.  She then developed  increasing severe agitation.  She has had to be restrained to keep her  from coming out of the bed and pulling all of her IVs, etc. out.  So,  now she is on a combination of Diprivan and Versed with a blood pressure  running around 70.  Her heart rate has come down from about 180  initially to 130.  She is much more sedated now.  We are awaiting the  transfer to [facility].  We are going to try the combination of Versed  and Diprivan.  She is going to get a central line by Dr. Leticia Penna, and  then hopefully once she is more stabilized, we can get her transferred.   Total ICU time now 2 hours.      Edward L. Juanetta Gosling, M.D.  Electronically Signed     ELH/MEDQ  D:  01/11/2008  T:  01/11/2008  Job:  [id number]

## 2010-08-31 NOTE — Discharge Summary (Signed)
NAMEEDY, MCBANE                  ACCOUNT NO.:  [id number]   MEDICAL RECORD NO.:  [id number]          PATIENT TYPE:  INP   LOCATION:  A301                          FACILITY:  [facility]   PHYSICIAN:  Lonia Blood, M.D.      DATE OF BIRTH:  1970-09-27   DATE OF ADMISSION:  12/12/2008  DATE OF DISCHARGE:  08/30/2010LH                               DISCHARGE SUMMARY   PRIMARY CARE PHYSICIAN:  The patient is unassigned.   DISCHARGE DIAGNOSES:  1. Right lower extremity deep vein thrombosis.  2. Anemia.  3. Tobacco abuse.  4. Chronic obstructive pulmonary disease.   DISCHARGE MEDICATIONS:  1. Lovenox 80 mg subcutaneously b.i.d. until INR is 2-3.  2. Coumadin is 10 mg daily.  3. Vicodin 5/500 one tablet q.6 h. p.r.n.  4. Combivent MDI 2 puffs b.i.d.   DISPOSITION:  The patient will be discharged home.  She will follow up  with me in 2 days.  She will have Home Health with her for daily PT/INR  checks until her INR is between 2 and 3.   Procedures performed this admission include venous ultrasound on December 12, 2008, that showed right lower extremity DVT involving the profunda  femoris and proximal femoral vein.  Right hip x-ray also on December 12, 2008, that showed no evidence of fracture or dislocation.  Chest x-ray  on December 13, 2008, showed emphysema with scattered scarring.   CONSULTATIONS:  None.   BRIEF HISTORY AND PHYSICAL:  Please refer to dictated history and  physical on admission by Dr. Skeet Latch.  In short, however, this  is a 40 year old female that came in with right leg pain and unable to  bear weight.  It has been associated with some swelling and apparently  symptoms have been going on for a few days.  The patient had no recent  surgery.  She did have history of drug abuse of using crack cocaine and  also smokes tobacco.  She is not on any birth control pills.  The  patient was evaluated in the ED and found to have right lower extremity  DVT, hence she was  admitted for further management.   HOSPITAL COURSE:  1. Right lower extremity DVT.  The patient was started on subcutaneous      Lovenox twice a day as well as Coumadin.  Her INR is still      subtherapeutic at 1.0.  We will therefore discharge her home on      subcutaneous Lovenox with Coumadin for continued management.  2. Anemia.  She had a hemoglobin ranging around 10 to 11 and that has      remained stable.  3. Tobacco abuse.  The patient has been counseled and she get a      nicotine patch.  4. COPD.  The patient has history of COPD, but she did not go into any      exacerbation.  Will continue with some nebulizers as needed and      will continue with that.  Otherwise, she is stable for discharge  and will proceed with discharge.      Lonia Blood, M.D.  Electronically Signed     LG/MEDQ  D:  12/15/2008  T:  12/16/2008  Job:  [id number]

## 2010-08-31 NOTE — Consult Note (Signed)
[name], [name]                  ACCOUNT NO.:  [id number]   MEDICAL RECORD NO.:  [id number]          PATIENT TYPE:  INP   LOCATION:  A340                          FACILITY:  [facility]   PHYSICIAN:  Edward L. Juanetta Gosling, M.D.DATE OF BIRTH:  Dec 20, 1970   DATE OF CONSULTATION:  01/08/2008  DATE OF DISCHARGE:                                 CONSULTATION   REASON FOR CONSULTATION:  Bilateral pneumonia.   HISTORY:  [name] is a 40 year old who is complaint of shortness of  breath, wheeze, and cough.  She is having significant pain in her ribs.  She has had fever.  She has had chills.  She has coughed up blood.   Her temperature was high as 103.  She also says that she has had  problems with asthma for sometime.   PAST MEDICAL HISTORY:  Positive for asthma.  She apparently has felt to  have some COPD as well.  She has had D&C and hysterectomy.  She has been  on albuterol, but it is not clear than she is actually taking that and  she was hospitalized here in August 2009 and was discharged home on  Medrol Dosepak and is not totally clear that she is taking that either.   SOCIAL HISTORY:  She is not married.  She lives in [place].  She  smokes 2 packs of cigarettes daily.  She says that she uses cocaine on a  fairly frequent basis and it has been a part of the problems with her  staying on her medications etc.  She is not using any alcohol.   FAMILY HISTORY:  Her father died of colon cancer.  Her mother is still  living; has heart disease and diabetes.   REVIEW OF SYSTEMS:  Except as mentioned, she is complaining of the left-  sided chest pain still and she is short of breath and feeling a little  bit better.   PHYSICAL EXAMINATION:  GENERAL:  She is awake and alert.  VITAL SIGNS:  Her temperature is 98.5, pulse 109, respirations 20, blood  pressure 113/67, and O2 sat is 92% on room air.  HEENT:  Pupils are reactive.  Nose and throat are clear.  Her mucous  membranes are moist.  CHEST:   Rhonchi bilaterally.  I do not hear any rales right now.  ABDOMEN:  Soft.  EXTREMITIES:  No edema.  CNS is grossly intact.   LABORATORY DATA:  Her white blood count is 14,300, hemoglobin is 11.8,  and platelets 338.  BMET, potassium is 3.3, BUN is 9, and creatinine  0.57.  Drug screen was as excepted positive for cocaine and she says  that she has been using cocaine recently and her chest x-ray shows  multifocal pneumonia.  She is on Levaquin.  She is on steroids and I am  going to go ahead and add vancomycin because I am concerned about the  fact that she was  hospitalized fairly recently and as she has a multifocal pneumonia which  could represent MRSA, and I am going to go ahead and get her start  on  vancomycin.   Thanks for allowing me to see her with you.      Edward L. Juanetta Gosling, M.D.  Electronically Signed     ELH/MEDQ  D:  01/08/2008  T:  01/09/2008  Job:  [id number]

## 2010-08-31 NOTE — Discharge Summary (Signed)
[name], [name]                  ACCOUNT NO.:  [id number]   MEDICAL RECORD NO.:  [id number]          PATIENT TYPE:  INP   LOCATION:  A340                          FACILITY:  [facility]   PHYSICIAN:  Lonia Blood, M.D.      DATE OF BIRTH:  [birth year]   DATE OF ADMISSION:  01/06/2008  DATE OF DISCHARGE:  09/23/2009LH                               DISCHARGE SUMMARY   PRIMARY CARE PHYSICIAN:  The patient is unassigned to [name].   DISCHARGE DIAGNOSES:  1. Right upper lung pneumonia, possibly aspiration type.  2. Otitis externa, secondary to cat scratch.  3. Polysubstance abuse.  4. Asthma.  5. Hypokalemia and hyperglycemia.   DISCHARGE MEDICATIONS:  1. Vicodin 5/500 one tablet q.6 h. p.r.n.  2. Levaquin 500 mg p.o. daily for 1 week.  3. Prednisone taper.  4. Proventil inhaler 2 puffs q.4 h. p.r.n.   DISPOSITION:  The patient insists on being discharge because of her  birthday.  She still has some swelling of her ear.  She was on IV  antibiotics and the recommendation was for her to be on IV antibiotics  for at least 2 more days.  We are however going to discharge her on  Levaquin as she asked for.   PROCEDURES PERFORMED THIS ADMISSION:  Chest x-ray performed on January 06, 2008, that shows new focal areas of pulmonary opacity in both lungs,  findings consistent with multifocal pneumonia, and also evidence of  bronchitis and pulmonary hyperinflation.   CONSULTATION:  Edward L. Juanetta Gosling, MD, Pulmonary.   BRIEF HISTORY AND PHYSICAL:  Please refer to dictated history and  physical on admission by Dr. Margaretmary Dys.  In short, however, the  patient is a 40 year old female who presented with cough, shortness of  breath, and fever.  Symptoms are getting worse over 24-48 hours.  She is  known to be an IV drug user, uses cocaine as well as alcohol and  tobacco.  She also has known history of asthma.  On exam, she was found  to have a swollen right ear.  She was tachycardiac with a rate  of 28.  She has mild fever.  She has decreased air entry bilaterally with  diffuse wheezing and rhonchi.  She also has some leukocytosis.  The  patient was subsequently admitted for further management with diagnosis  of bilateral pneumonia.   HOSPITAL COURSE:  1. Bilateral pneumonia.  The patient was admitted, started on IV      Levaquin 750 mg daily.  Pain control and her pyrexia is controlled.      She seemed to have done well in the beginning.  She became afebrile      within 24 hours and the patient started insisting wanted to go      home.  Dr. Juanetta Gosling was also consulted and further management was      recommended.  She was on IV antibiotics and pain control.  At the      time of discharge, the patient was afebrile.  White count has      decreased and  normalized.  She was to continue antibiotics for at      least 2 weeks.  2. Polysubstance abuse.  Again, the patient has been counseled      extensively.  I suspect she was to be discharged today, so she      could go back to using her drugs.  Today is her birthday and the      patient wants to celebrate her birthday.  3. Otitis externa.  This looks infected and swollen.  No culture is      currently available.  We will continue with her Levaquin for now.      If there is no response to the Levaquin in a few days, the patient      may have to go on other antibiotics including something for MRSA.      Lonia Blood, M.D.  Electronically Signed     LG/MEDQ  D:  03/10/2008  T:  03/11/2008  Job:  [id number]

## 2010-08-31 NOTE — Cardiovascular Report (Signed)
NAMEBRITTANNI, [name]                  ACCOUNT NO.:  [id number]   MEDICAL RECORD NO.:  [id number]         PATIENT TYPE:  CINP   LOCATION:                               FACILITY:  [facility]   PHYSICIAN:  Noralyn Pick. Eden Emms, MD, FACCDATE OF BIRTH:  1970/09/27   DATE OF PROCEDURE:  DATE OF DISCHARGE:                            CARDIAC CATHETERIZATION   TRANSESOPHAGEAL ECHO.   INDICATION:  MRSA, rule out endocarditis.   The patient was on the fentanyl and Versed drip.  The study was done at  the bedside.  She was intubated, NG tube was removed using digital  technique, and OmniPlane probe was advanced in distal esophagus without  incident.   Transgastric imaging revealed hyperdynamic LV function with an EF of  65%.  The aortic valve was trileaflet and normal.  Tricuspid valve was  normal.  Pulmonary valve was normal.  The mitral valve did have mild  regurgitation.  There was no obvious vegetation or frank prolapse of the  valve.  Right-sided cardiac chambers were normal.  There is no ASD or  VSD.  Imaging of the aorta showed no debris.   FINAL IMPRESSION:  1. Normal left ventricular function, ejection fraction 65%.  2. No evidence of subacute bacterial endocarditis or vegetation.  3. Mild mitral regurgitation.  4. Normal aortic valve.  5. Normal right-sided cardiac valves.  6. Normal aorta without debris.   The patient tolerated the procedure well.      Noralyn Pick. Eden Emms, MD, [facility]  Electronically Signed     PCN/MEDQ  D:  01/16/2008  T:  01/16/2008  Job:  [id number]

## 2010-08-31 NOTE — H&P (Signed)
[name], [name]                  ACCOUNT NO.:  [id number]   MEDICAL RECORD NO.:  [id number]          PATIENT TYPE:  INP   LOCATION:  A301                          FACILITY:  [facility]   PHYSICIAN:  Skeet Latch, DO    DATE OF BIRTH:  Sep 22, 1970   DATE OF ADMISSION:  12/12/2008  DATE OF DISCHARGE:  LH                              HISTORY & PHYSICAL   CHIEF COMPLAINT:  Right leg pain.   HISTORY OF PRESENT ILLNESS:  This is a 40 year old Caucasian female who  presents with right leg pain and slight shortness of breath.  Apparently, the patient states for the last few days, she has had severe  right leg pain, some swelling entirely to her right lower leg.  The  patient states that she was pushing a draft card few days ago, but  denies any trauma to her right lower extremity.  The patient states the  pain has been worsening as well as her shortness of breath.  The patient  came to the emergency room to be evaluated.  An ultrasound was done on  right lower extremity and was found to have a DVT.  The patient does  have a history of tobacco abuse, and history of substance abuse in the  past.   PAST MEDICAL HISTORY:  COPD, anxiety, pneumonia.   PAST SURGICAL HISTORY:  D&C, cesarean section.   SOCIAL HISTORY:  Positive for history of crack abuse.  No history of  alcohol abuse, but does have a history of 1-2 packs smoking per day for  20 years.   ALLERGIES:  PENICILLIN, CECLOR.   HOME MEDICATIONS:  Spiriva inhaler daily, Wellbutrin unknown dose, she  is not currently taking, oxycodone as needed.   REVIEW OF SYSTEMS:  GENERAL:  No fever, weight gain, weight loss.  HEENT:  Unremarkable.  CARDIOVASCULAR:  Denies any chest pain.  RESPIRATORY:  Positive for shortness of breath.  GASTROINTESTINAL:  No  abdominal pain, nausea, vomiting, diarrhea.  GENITOURINARY:  Unremarkable.  SKIN:  Unremarkable.  MUSCULOSKELETAL:  Positive for  right lower extremity pain.  NEUROLOGIC:  Unremarkable  __________   PHYSICAL EXAMINATION:  VITAL SIGNS:  Temperature is 98.6, pulse 102,  respirations 20, blood pressure 109/66.  She is sating 92%.  GENERAL:  She is well-developed, well-nourished, well-hydrated, in no  acute distress, slightly anxious.  HEENT:  Head, atraumatic, normocephalic.  Eyes:  PERRLA.  EOMI.  NECK:  Supple, nontender, nondistended.  RESPIRATORY:  Lungs are slightly decreased.  No rales, rhonchi, or  wheezing appreciated.  CARDIOVASCULAR:  Regular rate and rhythm.  No murmurs, rubs, or gallops.  ABDOMEN:  Soft, nontender, nondistended.  No rigidity or guarding.  Positive bowel sounds.  EXTREMITIES:  There is tenderness to her right groin area, right thigh  with some tenderness on her calf.  No obvious edema is noted.  No  erythema noted.  NEUROLOGIC:  Cranial nerves II through XII are grossly intact.  The  patient is alert, oriented x3.   LABORATORY DATA:  PT is 14.0, INR is 1.1.  Sodium 135, potassium 3.7,  chloride  103, CO2 is 24, glucose 118, BUN 4, creatinine 0.53.  CK-MB is  less than 1, troponin less than 0.05.  White count is 11.9, hemoglobin  12.8, hematocrit 37.2, platelet count 285,000.  Right lower extremity  venous Doppler showed a right lower extremity DVT involving the profunda  femoris and proximal femoral pain.   ASSESSMENT AND PLAN:  1. For right lower extremity deep venous thrombosis, the patient was      prescribed Lovenox 1 mg/kg twice daily at this time.  We will add      on Coumadin per pharmacy protocol also.  The patient will get daily      PT and INR checked also.  2. For her chronic obstructive pulmonary disease.  The patient has a      history of chronic obstructive pulmonary disease.  The patient will      be placed on nebulizer treatments every 4-6 hours as needed.  I do      not feel that the patient at this time needs to the placed on      steroids, this could change based on the patient's improvement for      the next 12-24  hours.  3. The patient does have a history of anxiety.  I will place the      patient on some antianxiety medications also as needed.  4. Lastly, the patient will be on GI prophylaxis also.   I explained to the patient that the patient will need followup with  [facility] regarding her PT and INRs once she is  discharged for her long-term Coumadin therapy.      Skeet Latch, DO     SM/MEDQ  D:  12/13/2008  T:  12/13/2008  Job:  [id number]

## 2010-08-31 NOTE — Group Therapy Note (Signed)
[name], [name]                  ACCOUNT NO.:  [id number]   MEDICAL RECORD NO.:  [id number]          PATIENT TYPE:  INP   LOCATION:  IC08                          FACILITY:  [facility]   PHYSICIAN:  Edward L. Juanetta Gosling, M.D.DATE OF BIRTH:  1970-05-31   DATE OF PROCEDURE:  DATE OF DISCHARGE:                                 PROGRESS NOTE   It is approximately 1705 now.  [name] has improved, stabilized.  We  have been able to decrease the Diprivan.  She seems to be holding on a  much lower dose.  She still has Versed going.  She has multiple  antibiotics.  She is on the sepsis protocol.  She now does have a  central venous line.  The hospital does have the rabies vaccine series,  so we are going to go and start that tonight.  We are going to hold the  flu vaccine.  I am not sure that there is any sort of interaction, but  we can hold that at least temporarily.  Her blood pressure has come up,  now in the 90s, and still rising.  Pulse rate is better at about 100-  110.  She is afebrile right now, so she has improved.  We are still  awaiting bed availability at [facility].   Critical care time total now 3-1/2 hours.      Edward L. Juanetta Gosling, M.D.  Electronically Signed     ELH/MEDQ  D:  01/11/2008  T:  01/11/2008  Job:  [id number]

## 2010-09-01 IMAGING — CR DG CHEST 1V PORT SAME DAY
1 series · 1 of 1 positions shown · non-contrast
Comparison: 01/11/2008 at [DATE] p.m.

CLINICAL DATA: PICC line placement.  Bilateral pneumonia.

PORTABLE CHEST - 1 VIEW SAME DAY

[view not recorded]
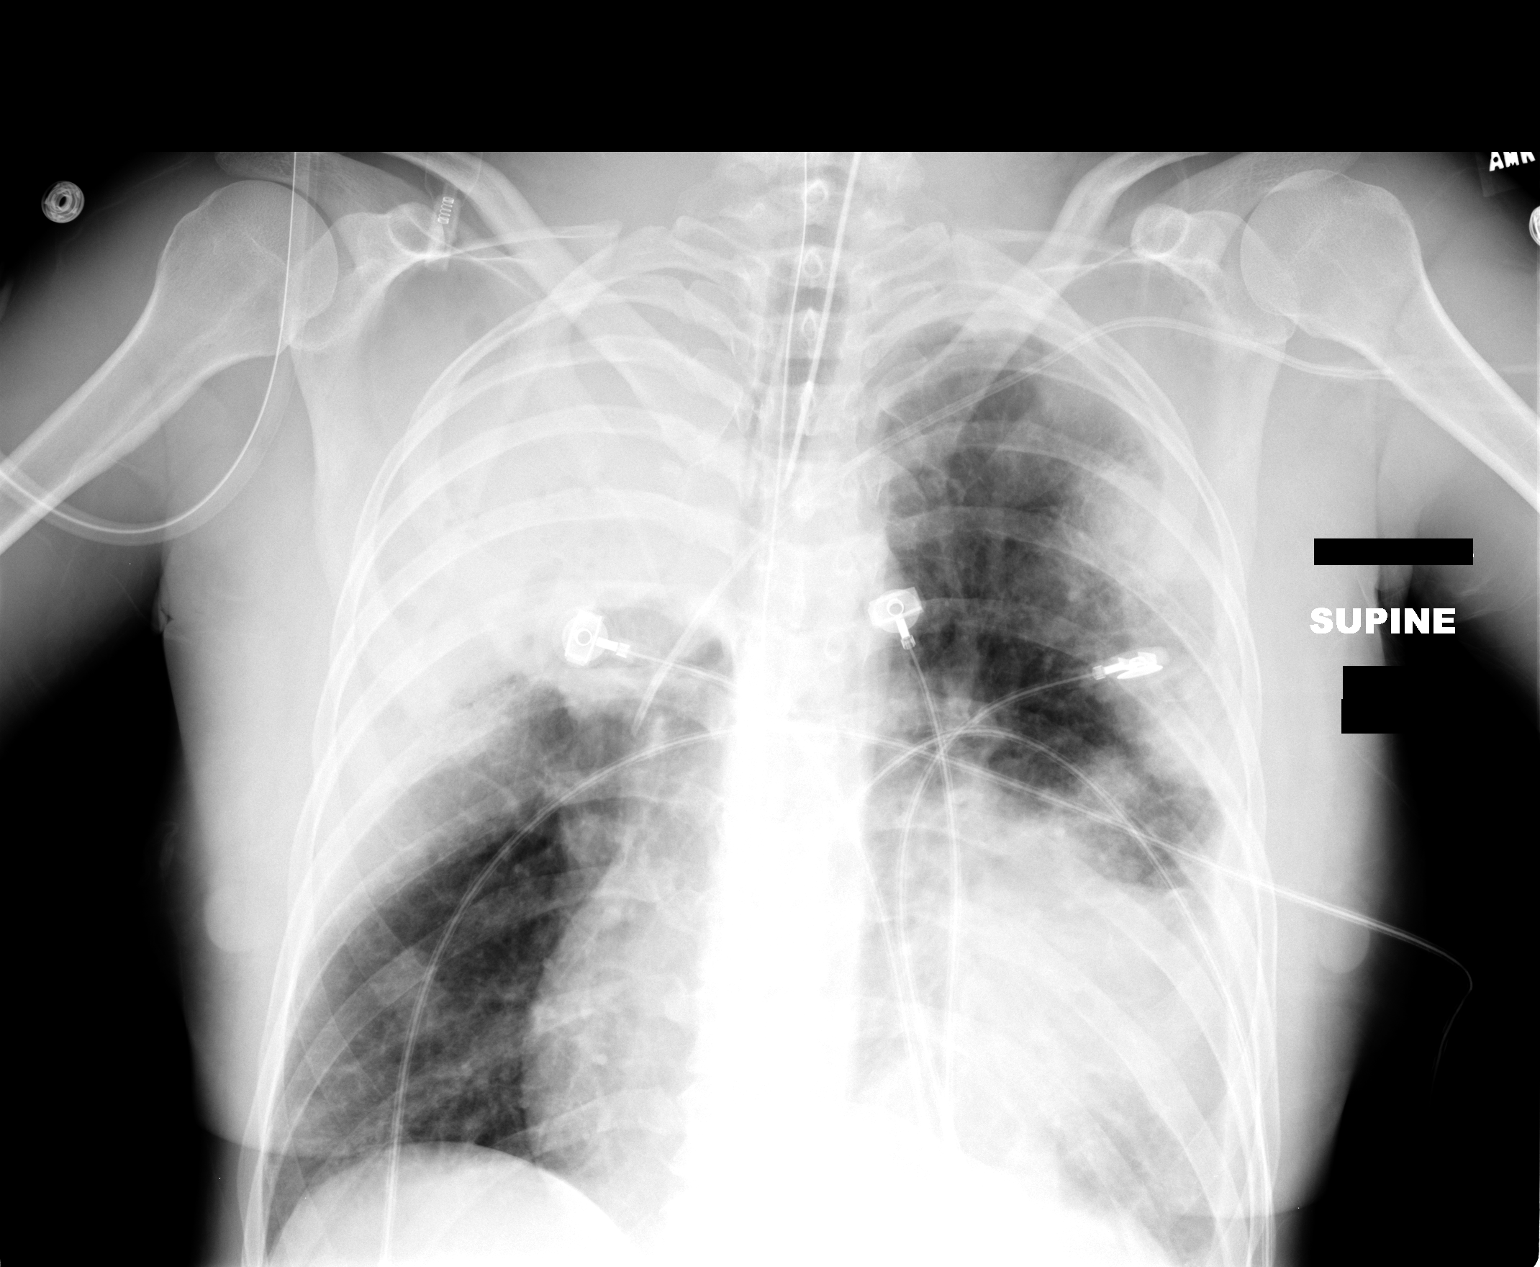

[1 of 1 positions shown; findings below may reference images not displayed]

FINDINGS: A left-sided PICC line has been inserted and the tip is
in the superior vena cava in good position.    The tip of the NG
tube is not visible but is below the diaphragm.  Endotracheal tube
appears in good position.

Again noted are extensive pneumonias bilaterally.  The pneumonia in
the right upper lobe has progressed with more extensive
consolidation.  The pneumonias in the left lung are unchanged.
Pulmonary vascularity remains normal.
IMPRESSION: PICC line good position.  Progressive pneumonia in the right upper
lobe.  Stable pneumonia on the left.

## 2010-09-01 IMAGING — CR DG CHEST 1V PORT
1 series · 1 of 1 positions shown · non-contrast
Comparison: Portable exam 3339 hours compared to 7608 hours.

CLINICAL DATA: Septic shock, pneumonia, question TB

PORTABLE CHEST - 1 VIEW

[view not recorded]
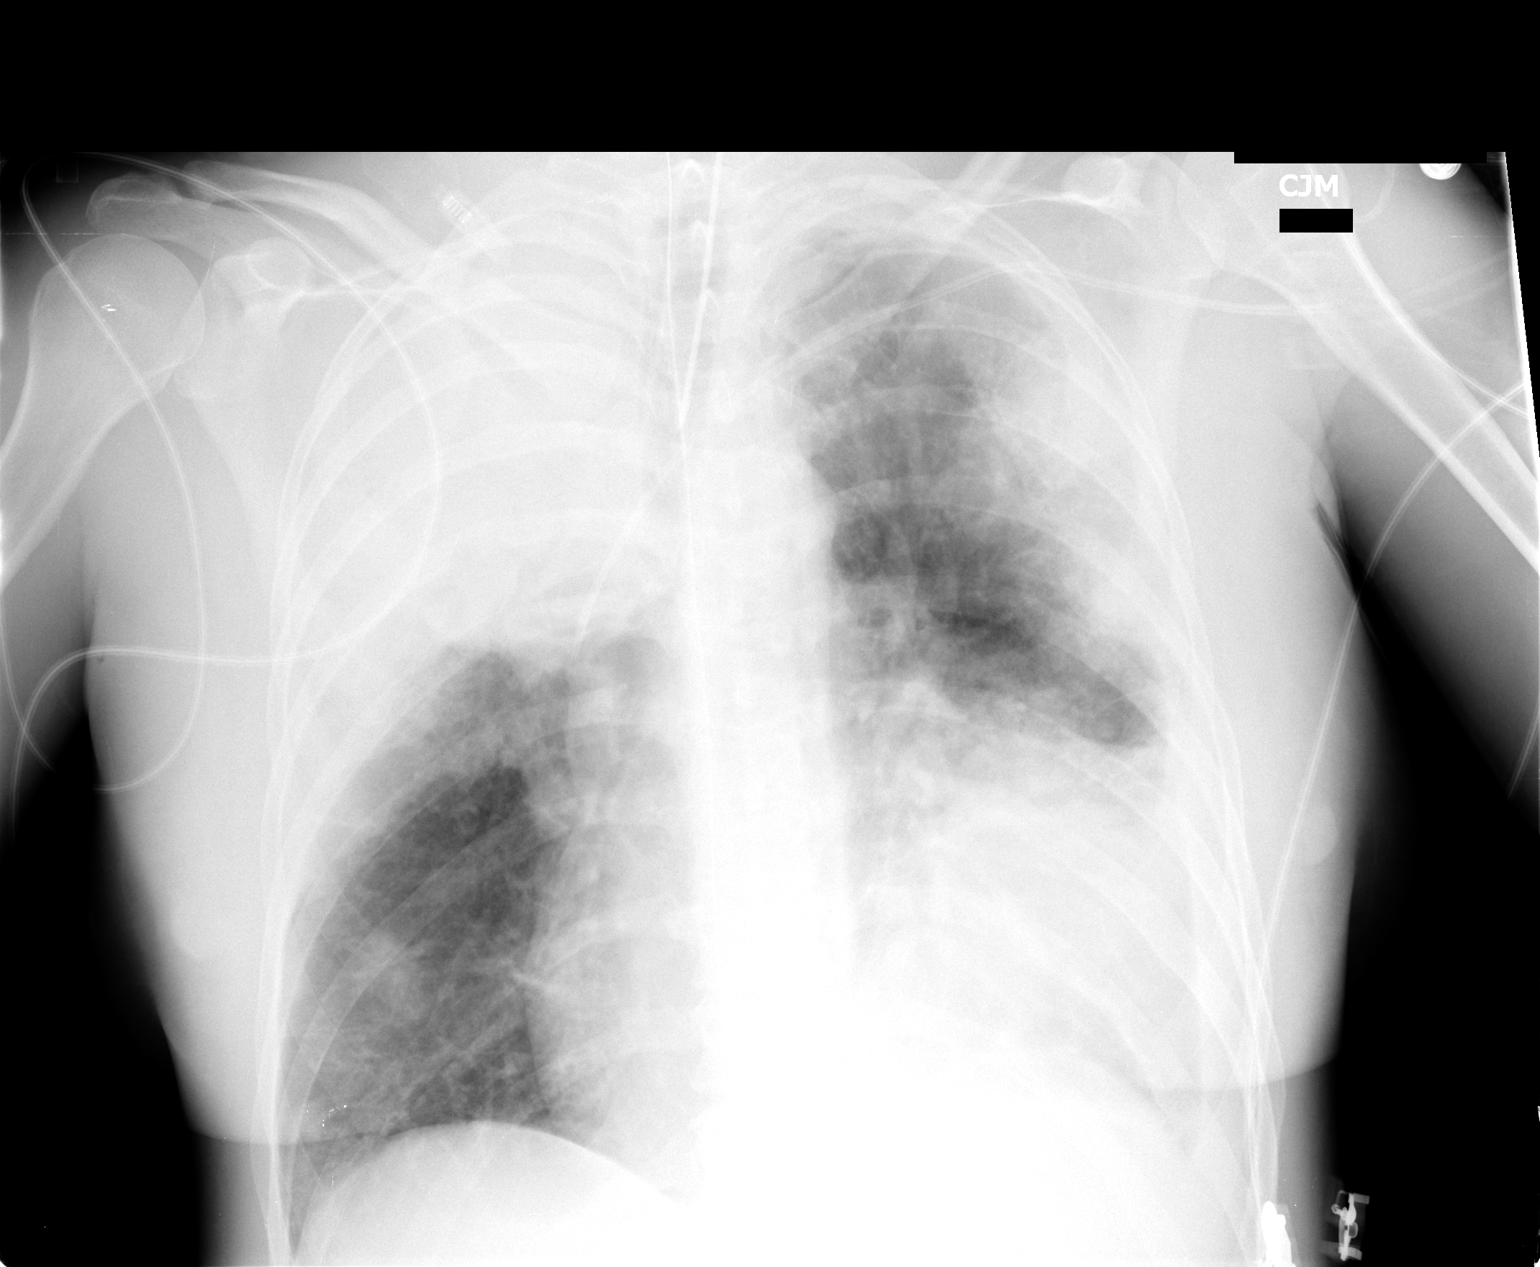

[1 of 1 positions shown; findings below may reference images not displayed]

FINDINGS: Endotracheal tube, left subclavian central venous catheter, and
nasogastric tube unchanged.
Extensive bilateral pulmonary infiltrates, most confluent in right
upper lobe and left lower lobe, appear slightly increased since
previous exam.
TB not excluded.
Persistent left pleural effusion without definite pneumothorax.
IMPRESSION: Diffuse bilateral pulmonary infiltrates, minimally increased.
Left pleural effusion.

## 2010-09-01 IMAGING — CR DG CHEST 1V PORT
1 series · 1 of 1 positions shown · non-contrast
Comparison: Portable exam 9444 hours compared to 01/06/2008

CLINICAL DATA: Hemoptysis, shortness of breath

PORTABLE CHEST - 1 VIEW

[view not recorded]
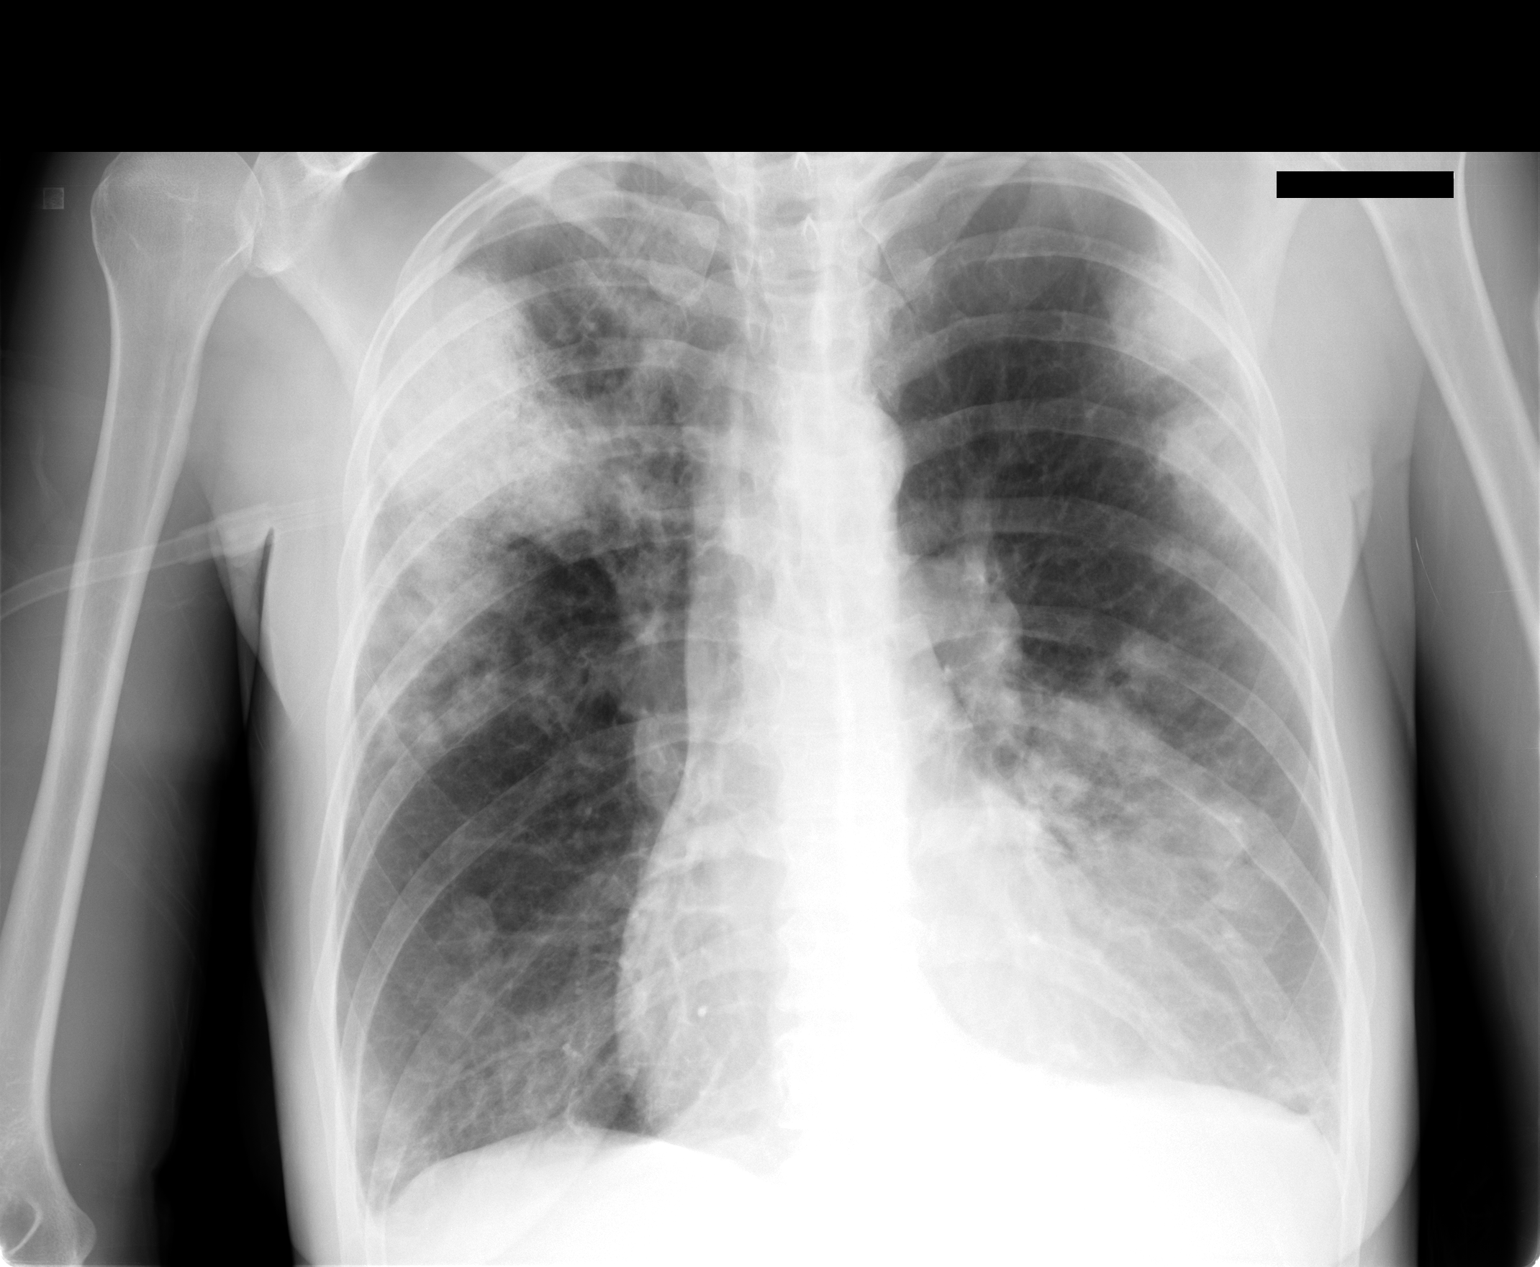

[1 of 1 positions shown; findings below may reference images not displayed]

FINDINGS: Heart size stable.
Mediastinal contours and pulmonary vascularity normal.
Significant worsening of bilateral pulmonary infiltrates involving
bilateral upper lobes and lingula.
Additional area of questionable infiltrate or nodularity is seen in
the lower right chest.
Many of the observed areas are somewhat nodular in appearance.
Minimal bibasilar atelectasis and probable subpulmonic left pleural
effusion.
Scattered peribronchial thickening.
IMPRESSION: Significant worsening in bilateral pulmonary infiltrates since
previous exam, with observed areas of opacity somewhat nodular in
appearance.
These most likely represent infection/pneumonia but other
considerations include septic emboli and alveolar hemorrhage.
Underlying pulmonary nodules not excluded, and follow-up until
resolution of the observed opacities is recommended.

## 2010-09-02 IMAGING — CR DG CHEST 1V PORT
1 series · 1 of 1 positions shown · non-contrast
Comparison: 01/11/2008.

CLINICAL DATA: Respiratory failure, pneumonia, and sepsis

PORTABLE CHEST - 1 VIEW

[view not recorded]
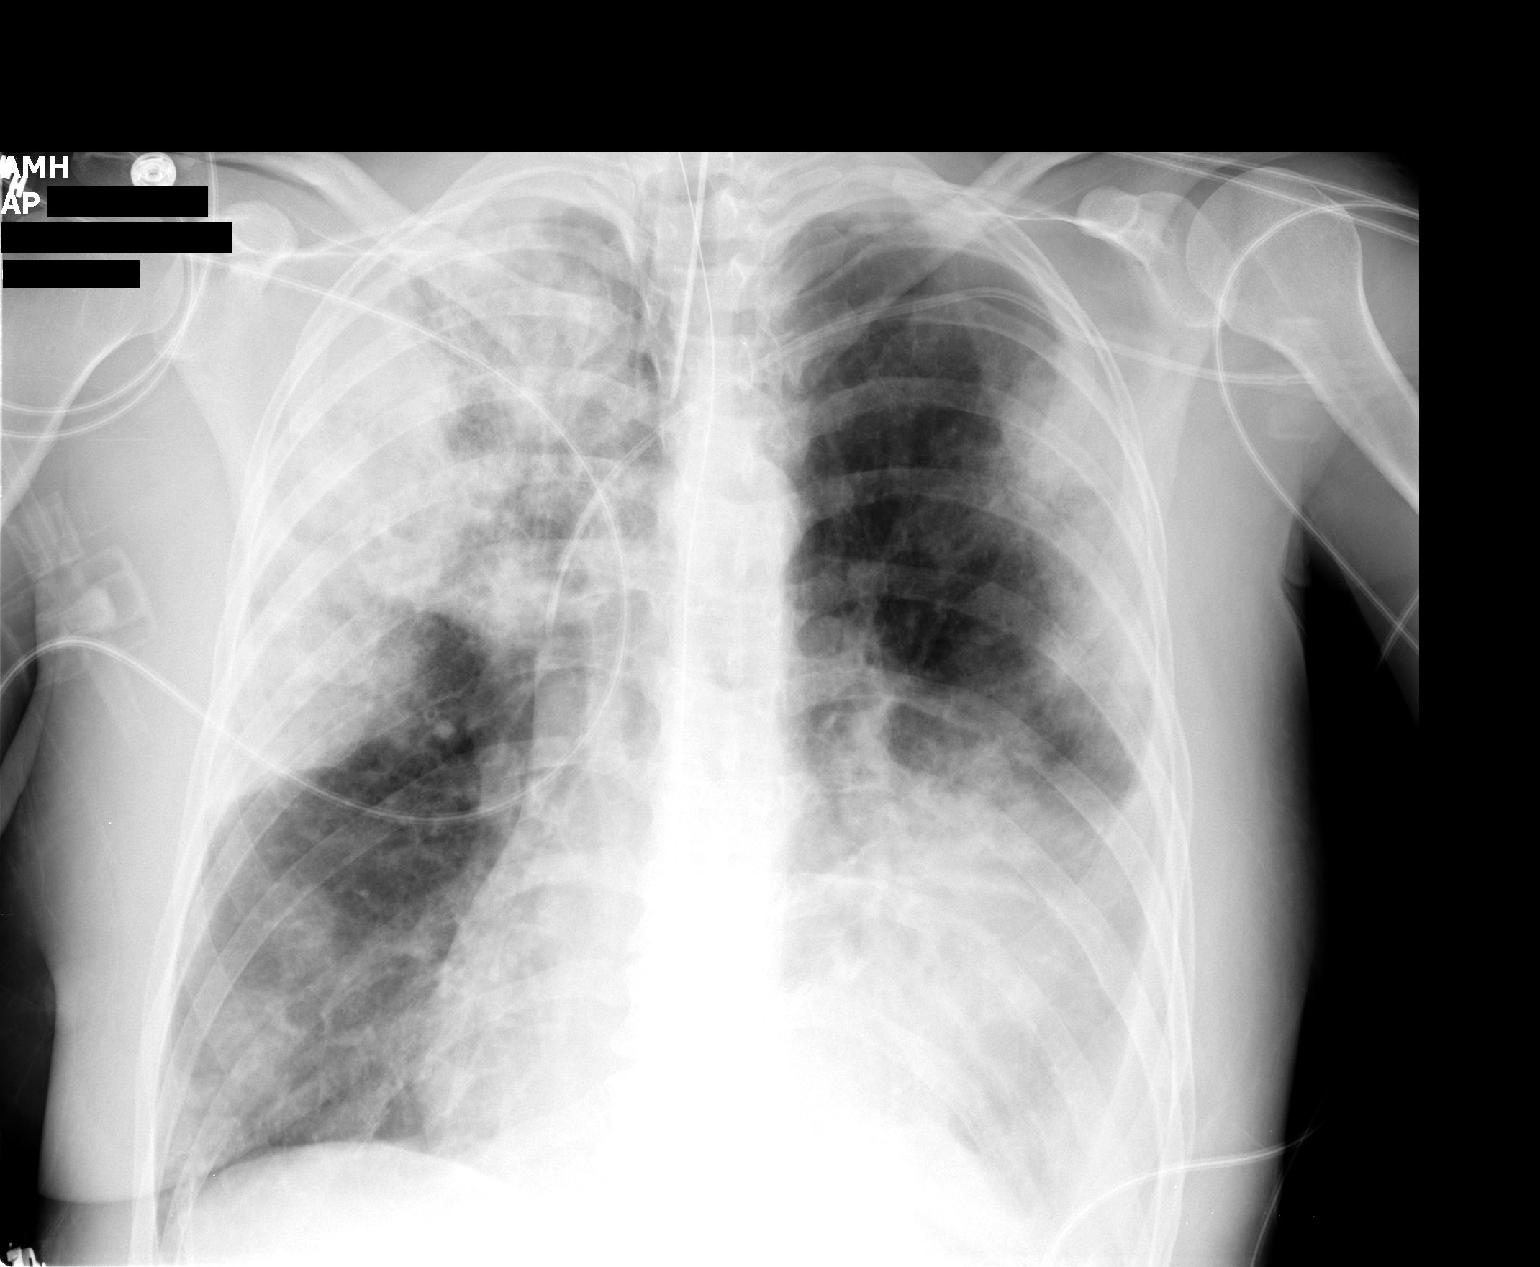

[1 of 1 positions shown; findings below may reference images not displayed]

FINDINGS: The endotracheal tube remains in satisfactory position,
with tip at the level of the clavicular heads.  Nasogastric tube
can be followed to the distal esophagus, but its distal aspect is
not included on this exam.  Left subclavian central venous catheter
remains in satisfactory position in the superior vena cava.

Bilateral patchy confluent areas of airspace disease are present,
most severe in the right upper lobe and the left lung base.
Compared to yesterday's radiograph, the airspace opacity in the
right upper lobe is less confluent, with increasing aeration in
this region.  A left pleural effusion is present.  No evidence of
edema.
IMPRESSION: 1. Interval improvement in aeration in the right upper lobe, in
this patient with severe bilateral pneumonia.
2.  Small left pleural effusion.

## 2010-09-03 IMAGING — CR DG CHEST 1V PORT
1 series · 1 of 1 positions shown · non-contrast
Comparison: 01/12/2008

CLINICAL DATA: 37-year-old female respiratory failure, pneumonia,
follow-up exam

PORTABLE CHEST - 1 VIEW

[view not recorded]
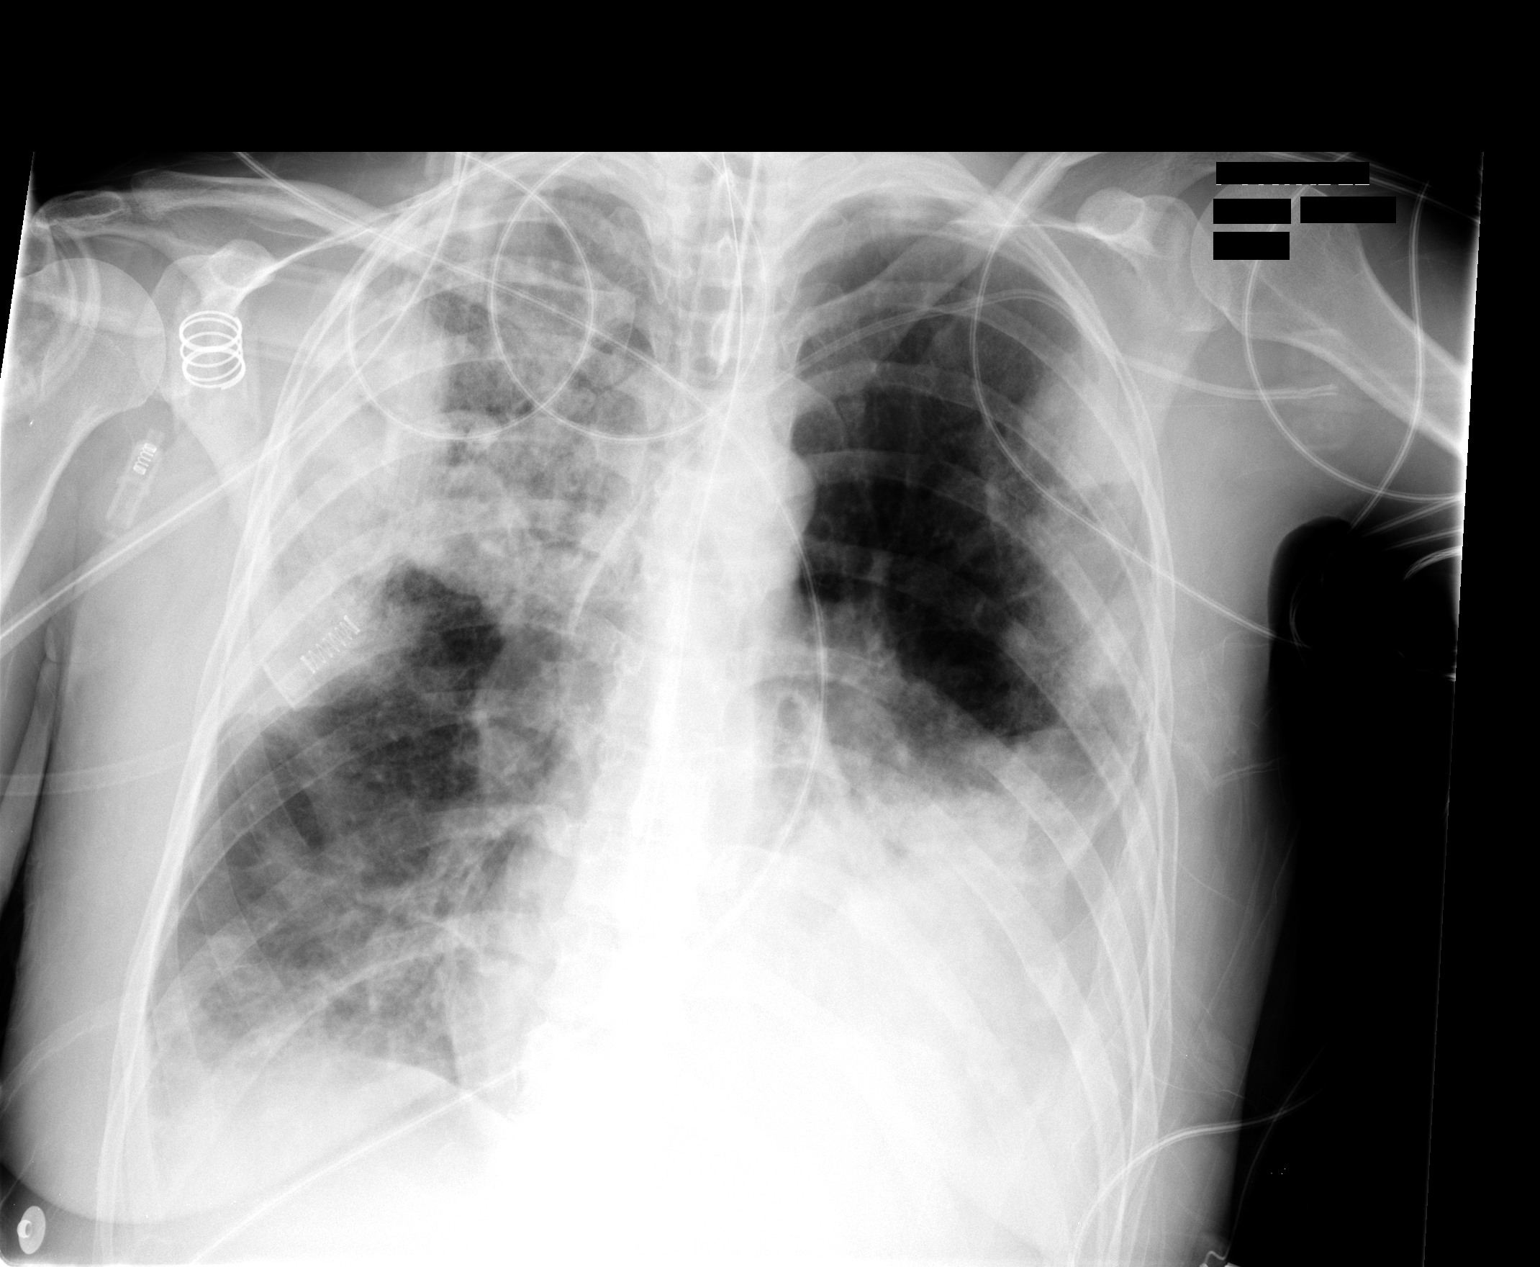

[1 of 1 positions shown; findings below may reference images not displayed]

FINDINGS: Endotracheal tube, NG tube, and left subclavian central
line all remain.  No changes in support apparatus.  Consolidated
patchy airspace disease persist in the right upper lobe,
peripherally in the left upper lobe, and more consolidated in the
lingula and left lower lobe.  Left pleural fluid is again noted.
No pneumothorax.
IMPRESSION: Stable bilateral airspace disease consistent with pneumonia and
left pleural effusion.

## 2010-09-03 NOTE — H&P (Signed)
NAME:  [name], [name]                            ACCOUNT NO.:  [id number]   MEDICAL RECORD NO.:  [id number]                   PATIENT TYPE:  INP   LOCATION:  A403                                 FACILITY:  [facility]   PHYSICIAN:  Tilda Burrow, M.D.              DATE OF BIRTH:  [birth year]   DATE OF ADMISSION:  09/18/2003  DATE OF DISCHARGE:                                HISTORY & PHYSICAL   ADMITTING DIAGNOSES:  1. Recurrent pneumonia.  2. Cigarette abuse.  3. Asthma.  4. Intrauterine pregnancy without prenatal care to date.   HISTORY OF PRESENT ILLNESS:  This 40 year old Caucasian female, gravida 8,  para 5, Ab1, 5 living children, is admitted at this time for evaluation of  her respiratory status.   [name] was seen in the emergency room complaining of productive cough and  chest discomfort.  Chest exam revealed deep sonorous rhonchi in both lower  and posterior lung fields.  Significant history is that the patient was  recently hospitalized, August 03, 2003 to August 07, 2003, for pneumococcal  pneumonia with sepsis, during which time pregnancy was diagnosed but not  worked up.  She was found to have a positive urine drug screen for cocaine.  She alleged this was the first use in over a year.  Interestingly, in March  of 2004, she was admitted for premature labor associated with use of crack  cocaine with a pregnancy and a that time, alleged the same thing, i.e., this  was the first use in quite some time.  Evaluation includes lab work which  shows a white count of 12,300 with 85 neutrophils, hemoglobin 12, hematocrit  31.  Arterial blood gas is performed which shows a pH of 7.455, PCO2 31.8,  PO2 57, quite low with patient on room air; she has subsequently been placed  on 2 L and has O2 saturation of 92% on pulse oximetry.  She is admitted for  antibiotic therapy, repeat chest x-ray to compare with recent  hospitalization results and assessment of the pregnancy.   Current pregnancy  was essentially unknown to patient.  She alleges she is  followed at the health department but they do not have a prenatal clinic.  She has not had prenatal blood work drawn.  At the time of last  hospitalization, initial phone contact was made with our obstetric service  but followup was not completed.  At this time, she is admitted for medical  evaluation of her pneumonia.   PAST MEDICAL HISTORY:  Past medical history positive for cocaine use  intermittently times years.   SURGICAL HISTORY:  1. D&Cs for miscarriages.  2. Five spontaneous vaginal deliveries; 1 child dead reportedly due to SIDS.     Last pregnancy delivered July 07, 2002 at 35 weeks after spontaneous     rupture of membranes occurred associated with cocaine abuse.  She had  indicated medical induction of labor but resulted in a cesarean delivery.     Interesting in the prenatal course was that she was induced after 4 days     of antibiotic prophylaxis and observation and arrested and did not     dilate.  The reason for the cesarean section was the baby had presented     mentoanterior with eyebrows palpable beneath the pubic ramus, with     efforts at flexing the vertex being unsuccessful.  The C-section was also     notable for intraoperative hemorrhage with 1500-mL blood loss.   PHYSICAL EXAMINATION:  GENERAL:  Physical exam reveals a thin, wiry  Caucasian female who appears older than her stated age due to chronic  cigarette abuse (2+ packs per day most of adult life).  She is coughing with  moderate productive sputum.  Chest x-ray results are pending at the time of  this dictation.  ABDOMEN:  Abdomen shows a gravid uterus with fetal heart tones present and  fetal monitoring performed showing no evidence of fetal distress.   LABORATORY DATA:  Oxygen saturation is 92% on 2 L of oxygen per minute.   PLAN:  1. IV antibiotic therapy.  2. Assessment of pregnancy.  3. We will admit until pneumonia is  well-recovered, in light of her     recurrent lungs problems after recent respiratory infection.     ___________________________________________                                         Tilda Burrow, M.D.   JVF/MEDQ  D:  09/18/2003  T:  09/19/2003  Job:  [id number]

## 2010-09-03 NOTE — H&P (Signed)
NAME:  [name], [name]                            ACCOUNT NO.:  [id number]   MEDICAL RECORD NO.:  [id number]                   PATIENT TYPE:  INP   LOCATION:  LDR1                                 FACILITY:  [facility]   PHYSICIAN:  Lazaro Arms, M.D.                DATE OF BIRTH:  1970-07-09   DATE OF ADMISSION:  06/30/2002  DATE OF DISCHARGE:                                HISTORY & PHYSICAL   HISTORY OF PRESENT ILLNESS:  The patient is a 40 year old white female,  gravida 7, para 5, abortus 1 with 4 living children with an estimated date  of delivery of 08/08/2002 by an 11-week sonogram and confirmed by a 28-week  sonogram; currently at 34-3/[redacted] week gestation. The patient has had only 2  visits of prenatal care with [place] during this pregnancy on 01/21 and  05/21/2002.  She had an ultrasound.  She presents today complaining of  spontaneous rupture of membranes at 0745 this morning with some cramping.  She also relates that she has a history of cocaine abuse and had been using  here recently, but did smoke crack last night. She denies any vaginal  bleeding, and she reports fetal movement.   PAST MEDICAL HISTORY:  Asthma.   PAST SURGICAL HISTORY:  D&C for loss.   PAST OBSTETRICAL HISTORY:  Five vaginal deliveries, 1 miscarriage, 1 of her  kids passed away with SIDS.   ALLERGIES:  Seasonal allergies.   MEDICATIONS:  Prenatal vitamins.   SOCIAL HISTORY:  Patient states she is unsure of paternity.   FAMILY HISTORY:  Significant for diabetes and colon cancer.   REVIEW OF SYSTEMS:  Review of systems today is significant for productive  cough. She smokes about a pack of cigarettes a day.   PHYSICAL EXAMINATION:  HEENT:  Unremarkable.  NECK:  Thyroid is normal.  LUNGS:  Lungs are clear in all fields.  No wheezing heard, but she does have  a productive cough and I have treated her for bronchitis.  HEART:  The heart is regular rate and rhythm without regurgitation or  gallops.  BREASTS:   Exam is deferred.  ABDOMEN:  The abdomen is small, vertex presentation.  PELVIC:  Rudean Hitt performed a vaginal exam.  York Spaniel it was 1-2 thick and  about a minus 3 station.  I have not repeated it because of ruptured  membranes in this early gestation.  EXTREMITIES:  Extremities show no edema.  NEUROLOGIC:  Exam is grossly intact.    IMPRESSION:  1. Intrauterine pregnancy at 34-1/[redacted] weeks gestation.  2. Preterm, premature rupture of membranes.  3. Cocaine abuse precipitating the ruptured membranes.  4. Asthma.  5. Limited prenatal care.   PLAN:  The patient is admitted.  A group B Strep culture was obtained.  She  was started on ampicillin.  She was also started on Zithromax and inhaler  for her asthma  and possible bronchitis.  She is contracting infrequently  currently.  We will take a conservative approach and keep her on external  fetal monitor, but hopefully get 24-48 hours of ruptured membranes to  enhance fetal lung maturity. I have discussed the plan with the patient and  the gentleman with her and they agree with that plan. All questions were  answered.   Blood type is A positive, antibody screen is negative.  Rubella is immune.  Hepatitis B, HIV, serologies all negative.  Pap smear was negative except  for trichomonas which was treated. GC and Chlamydia were negative. She did  not have any Glucola testing or MSAFB testing.                                               Lazaro Arms, M.D.    Loraine Maple  D:  06/30/2002  T:  06/30/2002  Job:  [id number]

## 2010-09-03 NOTE — Discharge Summary (Signed)
NAME:  [name], [name]                            ACCOUNT NO.:  [id number]   MEDICAL RECORD NO.:  [id number]                   PATIENT TYPE:  INP   LOCATION:  A403                                 FACILITY:  [facility]   PHYSICIAN:  Tilda Burrow, M.D.              DATE OF BIRTH:  [birth year]   DATE OF ADMISSION:  09/18/2003  DATE OF DISCHARGE:  09/22/2003                                 DISCHARGE SUMMARY   ADMISSION DIAGNOSES:  1. Recurrent pneumonia.  2. Cigarette abuse.  3. Asthma.  4. Intrauterine pregnancy without prenatal care.   DISCHARGE DIAGNOSES:  1. Recurrent bronchitis.  2. Cigarette abuse.  3. History of asthma, currently stable.  4. Intrauterine pregnancy at 58 weeks' gestation.  5. Intermittent cocaine use.   DISCHARGE MEDICATIONS:  1. Albuterol nebulizer solution 2.5 mg q.i.d.  2. Albuterol inhaler two puffs q.i.d.  3. Sterapred dose pack 12 day pack.  4. Nicoderm 21 mg patches to be applied to skin x1 week, then gradually     declining.   HISTORY OF PRESENT ILLNESS:  This 40 year old female, G8, P5, AB1 was  readmitted after being seen in the emergency room on September 18, 2003,  complaining of shortness of breath and cough.  She had white count of  12,300, 85 neutrophils, hemoglobin 12 and hematocrit 31.  Arterial blood gas  was pH 7.455, pCO2 31 and pO2 57.  The patient was placed on 2 L of oxygen  and increased his oxygen saturation to 92%.  Chest x-ray is performed with  results returning showing bronchitis with no new lobar consolidation and no  areas of pneumonia.  She has not sought prenatal care to date.  She was  admitted one month ago for severe bronchopneumonia and did not follow up.   PAST OBSTETRICAL HISTORY:  OB history consists of five spontaneous vaginal  deliveries.  Her last pregnancy and this pregnancy are notable for positive  urine drug screens for cocaine.  Most recent pregnancy was delivered by  Cesarean section.  The C-section was performed  mentum anterior position.  She had intraoperative hemorrhage at the C-section.   HOSPITAL COURSE:  The patient was admitted with initial lab work including  the aforementioned CBC and blood gas.  She had prenatal labs including as  follows:  Hepatitis B surface antigen negative, HIV nonreactive, blood type  A positive, urine drug screen positive for cocaine, herpes simplex type 1  and 2 antibody screens pending, RPR nonreactive.   Hospital course was notable for the patient improving and developing  improved oxygenation.  She was placed on Nicoderm patch which dramatically  reduced her craving for cigarettes.  Blunt conversations were had with the  patient and her boyfriend regarding her drug use.  At the time of discharge,  they are both strongly committed to eliminating this from her daily activity  and the patient is aware that urine  drug screens will be planned at prenatal  visits on an intermittent, but frequent basis.   FOLLOW UP:  Follow up in one week in our office for prenatal care.     ___________________________________________                                         Tilda Burrow, M.D.   JVF/MEDQ  D:  09/22/2003  T:  09/22/2003  Job:  [id number]

## 2010-09-03 NOTE — Discharge Summary (Signed)
NAME:  [name], DORE                            ACCOUNT NO.:  [id number]   MEDICAL RECORD NO.:  [id number]                   PATIENT TYPE:  INP   LOCATION:  A418                                 FACILITY:  [facility]   PHYSICIAN:  Tilda Burrow, M.D.              DATE OF BIRTH:  [birth year]   DATE OF ADMISSION:  06/30/2002  DATE OF DISCHARGE:  07/07/2002                                 DISCHARGE SUMMARY   ADMISSION DIAGNOSES:  1. Pregnancy, [redacted] weeks gestation.  2. Premature preterm rupture of membranes.  3. Cocaine abuse precipitating premature rupture of membranes.  4. Asthma.  5. Cigarette abuse.  6. Limited prenatal care.   DISCHARGE DIAGNOSES:  1. Pregnancy, [redacted] weeks gestation, delivered.  2. Premature preterm and prolonged rupture of membranes.  3. Cocaine abuse.  4. Asthma.  5. Cigarette abuse.  6. Cephalopelvic disproportion secondary to fetal malpresentation, mentum     (forehead) presentation.  7. Anemia secondary to intraoperative blood loss.   PROCEDURE:  1. Expectant observation with antibiotic prophylaxis x4 days.  2. On 07/04/2002, Pitocin induction of labor.  3. On 07/04/2002, primary low transverse cervical cesarean section.  4. On 07/04/2002, transfusion of two units of packed cells.   DISCHARGE MEDICATIONS:  1. Chromagen Forte b.i.d. x30 days.  2. Tylox one q.4h. p.r.n. pain.  3. Albuterol inhaler two puffs q.4h. p.r.n. wheezing.   FOLLOW UP:  The patient will follow up in five days for staple removal.   HOSPITAL COURSE:  This is a 40 year old female gravida 7, para 5, AB 1 with  four living children with EDC of 08/08/2002 by 11-week ultrasound who was  admitted after presenting to labor and delivery on 06/30/2002 at midmorning  complaining of rupture of membranes occurring 7:45 that a.m.  She relates a  history of cocaine abuse the night before, having smoked crack for the  first time in four months.  She denies vaginal bleeding and reports fetal   movement.   PAST MEDICAL HISTORY:  Positive for asthma.   PAST SURGICAL HISTORY:  D&C for miscarriage.   PAST OBSTETRICAL HISTORY:  Five vaginal deliveries, one miscarriage, one  child dying of SIDS.   ALLERGIES:  NO KNOWN DRUG ALLERGIES.   MEDICATIONS:  Prenatal vitamins.   SOCIAL HISTORY:  Married but living separate from husband of record.  Current partner and husband both have questions of paternity, particularly  the husband.   FAMILY HISTORY:  Positive for diabetes and colon cancer.   PHYSICAL EXAMINATION:  GENERAL:  Exam shows a slim Caucasian female who  appears older than her stated age.  CHEST:  Some wheezing heard and a productive cough.  She is being treated  for bronchitis.  ABDOMEN:  33-cm fundal height, vertex presentation.  Vaginal exam by initial  nursing is 1 to 2 cm thick, -3, preterm rupture of membranes confirmed by  gross rupture.  LABORATORY DATA:  Hemoglobin 11.3, hematocrit 33.6.  Blood type A positive.  Urine drug screen positive for cocaine.   HOSPITAL COURSE:  The patient was admitted and monitored which confirmed the  absence of uterine activity.  Vertex presentation was confirmed by  ultrasound which showed an estimated fetal weight of 2722 g, approximately 5  pounds 12 ounces.  The patient was allowed to stay on bedrest with bathroom  privileges, and of course, she did smoke.  She maintained afebrile status  without uterine activity for four days.  When she reached 35.0 weeks by  conceptus criteria, she was scheduled for induction which was initiated  early on 07/04/2002 with Pitocin induction.  She progressed to completely  dilated and pushed for approximately an hour without being able to be  deliver what seemed like a relatively small baby.  Epidural analgesia was in  place, and the first portion of her pushing was ineffectual.  We then were  able to discern that the suspected presentation was mentum anterior with the  eyebrows palpable  underneath the right pubic ramus.  Efforts to flex the  vertex were unsuccessful using vacuum extractor, so we knew were left with  the need for cesarean delivery for cephalopelvic disproportion.  C-section  was notable for hemorrhage secondary to bleeding from uterine veins.  A 1500  EBL was estimated.  The infant was a 5 pound 6 ounce female with Apgars of 6  and 9.   Postoperatively, the patient did well.  She received two units of packed  cells in the recovery room.  Hemoglobin postoperative was 9.0 and 26.2,  dropping to 8.2 and 23.5 on postoperative day #2.  The patient remained  afebrile, tolerating the anemia well.  She showed appropriate interest in  the baby.  She was very strongly supported by the current boyfriend, the  likely father of the infant.  The social issues were somewhat confused in  that the husband is not visiting or being allowed to communicate with the  patient.  The patient was referred to social service for further  clarification of paternity issues.   The patient is stable for discharge on 07/07/2002 to hotel status unless  baby's jaundice allows baby to be discharged today.   FOLLOW UP:  The patient is to follow up in four days.                                               Tilda Burrow, M.D.    JVF/MEDQ  D:  07/07/2002  T:  07/08/2002  Job:  [id number]

## 2010-09-03 NOTE — Discharge Summary (Signed)
NAME:  [name], [name]                            ACCOUNT NO.:  [id number]   MEDICAL RECORD NO.:  [id number]                   PATIENT TYPE:  INP   LOCATION:  A226                                 FACILITY:  [facility]   PHYSICIAN:  Vania Rea, M.D.              DATE OF BIRTH:  1970/06/10   DATE OF ADMISSION:  08/03/2003  DATE OF DISCHARGE:                                 DISCHARGE SUMMARY   NO DICTATION FOR THIS JOB.     ___________________________________________                                         Vania Rea, M.D.   LC/MEDQ  D:  08/04/2003  T:  08/04/2003  Job:  [id number]

## 2010-09-03 NOTE — Op Note (Signed)
   NAME:  [name], [name]                         ACCOUNT NO.:  [id number]   MEDICAL RECORD NO.:  [id number]                   PATIENT TYPE:  INP   LOCATION:  A418                                 FACILITY:  [facility]   PHYSICIAN:  Lazaro Arms, M.D.                DATE OF BIRTH:  [birth year]   DATE OF PROCEDURE:  07/04/2002  DATE OF DISCHARGE:                                 OPERATIVE REPORT   EPIDURAL NOTE:   INDICATIONS FOR PROCEDURE:  Eilah is a 40 year old gravida 5, para 3, abortus  1 at [redacted] weeks gestation being induced preterm for prolonged rupture of  membranes.  She is having regular uterine contractions and requesting an  epidural.   DESCRIPTION OF PROCEDURE:  The patient is placed in sitting position; iliac  crests are identified  The patient received 1000 mL of LR bolus.  The L2-L3  interspace is marked, prepped, and field draped.  Lidocaine 1% is injected  as a skin wheal.  A 17-gauge Touhy needle was placed in the epidural space,  1 pass, using loss of resistance technique without difficulty.  When he  epidural space was identified 10 mL of 0.125% bupivacaine is given as a test  dose.  The epidural catheter is then threaded; 5 mL into the epidural space  and 10 cm at the skin.  An additional 10 mL for 0.125% bupivacaine was given  without incident.   It is taped down sterilely.  The continuous pump is hooked up.  She is given  a 5 mL of 0.125% bupivacaine with 2 mcg/mL of Fentanyl; and it is continued  at a continuous rate of 12 mL an hour.  The patient tolerated the procedure  well with no adverse reaction.  She is having some blood pressure drops  being treated with ephedrine.  There was reactive NST throughout.                                               Lazaro Arms, M.D.    Loraine Maple  D:  07/04/2002  T:  07/04/2002  Job:  [id number]

## 2010-09-03 NOTE — Discharge Summary (Signed)
NAME:  [name], [name]                            ACCOUNT NO.:  [id number]   MEDICAL RECORD NO.:  [id number]                   PATIENT TYPE:  INP   LOCATION:  A226                                 FACILITY:  [facility]   PHYSICIAN:  Hanley Hays. [place], M.D.           DATE OF BIRTH:  1971-01-22   DATE OF ADMISSION:  08/03/2003  DATE OF DISCHARGE:  08/07/2003                                 DISCHARGE SUMMARY   DIAGNOSES:  1. Pneumococcal sepsis.  2. Right middle lobe infiltrate.  3. Substance abuse, cocaine.  4. Tobacco abuse.  5. Asthma.  6. Intrauterine pregnancy with last menstrual period April 26, 2003.   DISPOSITION:  The patient is being discharged to home.   DISCHARGE MEDICATIONS:  1. Prednisone 20 daily x3 days, 10 mg daily x3 days, 5 mg daily x2 days,     then discontinue.  2. Advair 250/50 one puff b.i.d.  3. Albuterol meter dosed inhaler or nebulizer treatment q.4h. as needed.  4. Zantac 75 mg or Tums for indigestion.  5. Prenatal multivitamin at bedtime or daily.  6. Milk of magnesia p.r.n. constipation.  7. Tylenol for pain.  8. Zithromax 250 mg daily to complete a 14 day course.   CONDITION ON DISCHARGE:  Improved.   FOLLOWUP:  1. Dr. Lodema Hong as new patient in one week.  2. Follow up with mental health on August 11, 2003, at 2:30 p.m. with Loistine Simas.  3. The patient is to arrange followup with Dr. Emelda Fear.   HOSPITAL COURSE:  A 40 year old Caucasian female with known tobacco and  substance abuse presented to the emergency room with a two to three day  history of chills, fever, cough, and progressive shortness of breath.  Initial chest x-ray revealed a large grounded opacity in the medial right  mid lung, most likely consistent with pneumonia.  She was also noted to have  a pronounced leukocytosis.  Blood cultures subsequently grew Pneumococcus.  Her wheezing was slow to improve, but was much improved with O2 saturations  on room air at the time of discharge in  the high 90s, and she was able to go  outside and smoke.  She was counseled on smoking cessation from the  standpoint of progressive lung disease as well as pregnancy.  She was  counseled on substance abuse, and mental health will put her into the high  risk program.  At the time of discharge, the patient is alert and pleasant.  She is in no distress.  She still has a coarse cough, but it is improved.  She states her breathing is back to baseline.  Lungs reveal inspiratory and  expiratory rhonchi, equal air movement.  Respiratory rate is about 18 at  rest.  There are no rales noted.  The mental status is intact.  The heart is  regular rate of 86.  Abdomen is protuberant with palpable uterus,  nontender.  Extremities are without clubbing or cyanosis.  There is trace right lower  extremity edema compared with the left.  The mental status is intact with no  focal deficits noted.  She has no skin rash with the exception of  _____________.  The patient is discharged to home in stable condition.  Social Services has provided a new nebulizer for the patient.  The discharge  plan and issues were reviewed again with her and her significant other who  seemed to have reasonable understanding.     ___________________________________________                                         Hanley Hays. Josefine Class, M.D.   FED/MEDQ  D:  08/07/2003  T:  08/08/2003  Job:  [id number]   cc:   Tilda Burrow, M.D.  [address] [place]  [place] [address]  Fax: [phone]   Dr. Lodema Hong

## 2010-09-03 NOTE — Op Note (Signed)
NAME:  [name], [name]                         ACCOUNT NO.:  [id number]   MEDICAL RECORD NO.:  [id number]                   PATIENT TYPE:  INP   LOCATION:  A418                                 FACILITY:  [facility]   PHYSICIAN:  Tilda Burrow, M.D.              DATE OF BIRTH:  January 25, 1971   DATE OF PROCEDURE:  DATE OF DISCHARGE:                                 OPERATIVE REPORT   PREOPERATIVE DIAGNOSES:  1. Pregnancy at 35 weeks.  2. Cephalopelvic disproportion secondary to fetal malpresentation, mentum     (forehead) anterior presentation.   POSTOPERATIVE DIAGNOSES:  1. Pregnancy at 35 weeks.  2. Cephalopelvic disproportion secondary to fetal malpresentation, mentum     (forehead) anterior presentation.  3. Anemia secondary to intraoperative blood loss.   PROCEDURES:  1. Primary low transverse cervical cesarean section.  2. Transfusion, two units packed red blood cells.   SURGEON:  Tilda Burrow, M.D.   ASSISTANT:  Jacklyn Shell, C.N.M.   ANESTHESIA:  Spinal, [place], C.R.N.A.   COMPLICATIONS:  Forehead-first presentation of infant resulting in outlet  dystocia, unable to delivery baby past +2 to +3 station, with eyebrows and  eyes palpable beneath the right pubic ramus.   DESCRIPTION OF PROCEDURE:  After a decision of cesarean section as described  in the preop note, the patient was taken to the OR with Foley catheter in  place, set up, epidural catheter removed, and spinal anesthesia quickly  introduced.  The epidural had been allowed to completely deplete while she  was still trying to push the baby out.  She then was prepped and draped in  supine position with legs in low lithotomy [place] leg supports and a  Pfannenstiel-type incision performed.  The position of the mother was so  that we could elevate the vertex during the delivery.  A Pfannenstiel-type  incision was performed and the bladder flap developed on the extremely  dilated lower uterine  segment.  We could palpate fluid in front of the  vertex and made an incision at the level of the chin.  There were generous  varicosities encountered during the effort to reach the infant.  The  incision was extended laterally using index finger traction and the fetal  vertex elevated out of the pelvis by placing upward traction on the  shoulders, sliding the operator's left hand beneath the vertex while having  my assistant, Jacklyn Shell, C.N.M., do a vaginal exam and gently  elevate the head to allow access to the top of the head.  The vertex was  rotated into the incision, the scalp electrode disconnected, and the infant  delivered through the incision without difficulty and passed to [employer].  Halm, D.O., for neonatal care.   Cord blood samples were obtained after [place] clamps were placed on the  uterine edges due to profuse bleeding.  We obtained cord blood venous  sample, were not able to  obtain an arterial sample until the placenta was  delivered and we aspirated from what was thought to be an artery on the  surface of the placenta.   The placenta was sent for pathology.  The uterus was closed in a two-layer  running locking closure, starting at each lateral angle and pulling to the  midline.  This was quite challenging due to the distortion of the tissues  and the fact that we had cut through just to the level of cutting right  through the anterior cervix.  Eventually tissue reapproximation was  considered quite good.  Hemostasis was similarly satisfactory.  One single  vertical mattress suture was necessary to complete the adequacy of  hemostasis.  The bladder flap was loosely reapproximated using 2-0 chromic  and then the procedure considered satisfactorily completed by irrigating the  abdomen with antibiotic solution, closing the anterior peritoneum with 2-0  chromic, closing the fascia with 0 Vicryl, closing the subcu tissues with  running 2-0 plain, and  staple closure of the skin.  Irrigation was performed  periodically throughout the closure process and the patient then went to the  recovery room.  Initially the patient's condition in the recovery room was  quite stable despite the estimated blood loss of 1500-1700 mL, but she  developed a tachycardia to 150 with a blood pressure of 90/60, slightly  lower than her normal blood pressure, and so a decision was made to  transfuse two units of packed cells, which was performed prior to her  transfer to the floor.                                               Tilda Burrow, M.D.    JVF/MEDQ  D:  07/04/2002  T:  07/05/2002  Job:  [id number]

## 2010-09-03 NOTE — H&P (Signed)
NAME:  [name], [name]                            ACCOUNT NO.:  [id number]   MEDICAL RECORD NO.:  [id number]                   PATIENT TYPE:  INP   LOCATION:  A427                                 FACILITY:  [facility]   PHYSICIAN:  Tilda Burrow, M.D.              DATE OF BIRTH:  04/14/1971   DATE OF ADMISSION:  12/21/2003  DATE OF DISCHARGE:                                HISTORY & PHYSICAL   ADMISSION DIAGNOSES:  Pregnancy at [redacted] weeks gestation, minimal prenatal  care, spontaneous rupture of membranes without labor, history of substance  abuse.   HISTORY OF PRESENT ILLNESS:  This is a 40 year old female, gravida 8, para  5, AB 2, with limited prenatal care, here is admitted after a pregnancy  course essentially unregistered for this pregnancy.  She was hospitalized  under our care September 18, 2003, for four days for severe, recurrent bronchitis  and pneumonia.  She was approximately [redacted] weeks gestation at that time.  She  had lobar pneumonia which required IV antibiotic therapy.  Admitting history  was notable at that time for urine drug screen positive for cocaine.  The  patient states that she has been clean for the last two weeks.  She was down  stairs on the third floor taking care of her last child, which was delivered  by cesarean section for brow presentation.  Membrane rupture was confirmed  upon arrival in labor and delivery.  Fetal heart rate tracing show a  reactive pattern without decelerations.  She is admitted for cesarean  section and tubal ligation.   PAST MEDICAL HISTORY:  Chronic smoker with suboptimal blood gasses due to  smoking history.   OB HISTORY:  Five spontaneous vaginal deliveries, cesarean section for  mentoanterior presentation, the last baby with intraoperative hemorrhage  noted at the cesarean section.   ASSESSMENT:  1.  Pregnancy 39 weeks.  2.  Prior cesarean section.  Not for trial of labor.  3.  Elective sterilization.  4.  Minimal prenatal care  secondary to noncompliance.  5.  History of substance abuse.   PLAN:  Cesarean section and tubal ligation on December 21, 2003.     ___________________________________________                                         Tilda Burrow, M.D.   JVF/MEDQ  D:  12/21/2003  T:  12/21/2003  Job:  [id number]   cc:   Jeoffrey Massed, M.D.  [address]  [place]  [place] [address]  Fax: [phone]

## 2010-09-03 NOTE — Group Therapy Note (Signed)
NAME:  [name], [name]                            ACCOUNT NO.:  [id number]   MEDICAL RECORD NO.:  [id number]                   PATIENT TYPE:  INP   LOCATION:  A226                                 FACILITY:  [facility]   PHYSICIAN:  Vania Rea, M.D.              DATE OF BIRTH:  [birth year]   DATE OF PROCEDURE:  DATE OF DISCHARGE:                                   PROGRESS NOTE   This is a 40 year old lady with a history of a last menstrual period on  January 8 who was admitted with right lower lobe pneumonia and respiratory  failure.  Also she is allergic to PENICILLIN.  She was therefore started on  vancomycin and Zithromax and admitted to the intensive care unit, initially  on 100% nonrebreather.   Clinically she has improved over the past 24 hours.  She is breathing much  more easily.  Her pain is controlled with intravenous Toradol, which has  been approved by Dr. Despina Hidden.   SUBJECTIVE:  GENERAL:  The patient says that she does feel much better.  The  pain is much less.  Shortness of breath is much less.  She is eating well.  As a matter of fact, we are not giving her enough food.  VITAL SIGNS:  Temperature 97.o; pulse 76; respirations 18; blood pressure  117/64; she is saturating at 97% on three liters by nasal cannula; she is  also receiving nebulizers.  HEENT:  She is pink and anicteric.  Pupils equal, round and reactive to  light.  CHEST:  She has good air entry.  She has harsh breath sounds over the right  mid zones and prolonged expiration.  CARDIOVASCULAR:  She has regular rhythm.  ABDOMEN:  Obese.  She does have a gravid uterus.  EXTREMITIES:  There is no edema, 2+ pulses.  CNS:  She is alert and oriented times three.   LABORATORY DATA:  Her white count has increased from 14.7 on admission to  19,600 this morning.  Hemoglobin is 9.5 and hematocrit 94.4.  Her platelet  count is 226 and 96% neutrophils.  Sodium is 134, potassium 4.2, chloride  110, CO2 20, glucose 198, BUN 8,  creatinine 0.6, calcium 7.1.  Urine  cultures and blood cultures;  urine culture has grown nothing and one of two  blood cultures growing gram-positive cocci pairs, which we will presume as  Pneumococcus and this should be easily treated by the vancomycin.   ASSESSMENT:  1. Right lower lobe pneumonia, presenting with severe hypoxia, much     improved.  2. Leukocytosis, increasing, but we feel that she is on the right treatment.  3. The patient is also pregnant and the obstetricians are aware and will be     following her.   The patient is now sufficiently stable to be transferred out onto the  regular medical floor.      ___________________________________________  Vania Rea, M.D.   LC/MEDQ  D:  08/04/2003  T:  08/04/2003  Job:  [id number]

## 2010-09-03 NOTE — Consult Note (Signed)
NAME:  [name], [name] NO.:  [id number]   MEDICAL RECORD NO.:  [id number]                   PATIENT TYPE:  INP   LOCATION:  A403                                 FACILITY:  [facility]   PHYSICIAN:  Melvyn Novas, MD        DATE OF BIRTH:  01-25-1971   DATE OF CONSULTATION:  09/19/2003  DATE OF DISCHARGE:                                   CONSULTATION   CONSULTING PHYSICIAN:  Melvyn Novas, MD   REFERRING PHYSICIAN:  Tilda Burrow, M.D.   REASON FOR CONSULTATION:  The patient is a 40 year old white female, [redacted]  weeks pregnant, with a history of severe tobacco dependence and asthmatic  bronchitis.  The patient had an increasing cough with only whitish sputum  production over two to three days.  She became increasingly dyspneic and was  subsequently admitted to the hospital for control of respiratory symptoms.  She specifically denied any hemoptysis, orthopnea or paroxysmal nocturnal  dyspnea.  She denied any fever, rigors or chills.  Upon being admitted to  the hospital, a chest x-ray showed no active infiltrate.  She had  significant inspiratory and expiratory wheezing and sonorous rhonchi and was  placed on dual antibiotic therapy as well as steroids, Solu-Medrol and  nebulizer therapy.  The patient has responded moderately to the above given  regimen.   PAST MEDICAL HISTORY:  1. Pneumococcal pneumonia in the previous year.  2. Three hospitalizations over the previous two years for respiratory     embarrassment and seven total hospitalizations for respiratory related     illnesses over the preceding ten years.   LABORATORY DATA:  White blood cell count 13.2 with a hemoglobin of 11.6 and  no shift noted on CBC.  Blood gases reveal a pH of 7.455, a p02 of 57, a  pC02 of 32 and 91% saturation on room air.  The patient is in mild to  moderate respiratory distress at the present time.  Current 02 saturation is  96% on room air.   PHYSICAL EXAMINATION:  HEENT:  Conjunctivae are pink.  There is no jugular  venous distention and no carotid bruits.  LUNGS:  Coarse rhonchi, mild to moderate expiratory and inspiratory  wheezing, minimal basilar crepitations in the left base, minimal use of  accessory muscles of respiration.  HEART:  Regular rhythm, S1 and S2 with normal intensity. There is no S3 or  S4 gallop auscultated.  There is no murmur.  There are no heaves, thrills or  rubs noted.  ABDOMEN: Soft.  There is no hepatojugular reflex noted.  EXTREMITIES:  No evidence of cyanosis, clubbing or edema.  NEUROLOGIC:  The patient is alert and cooperative.   Of note on laboratories is  positive cocaine on a urine drug screen.  The  patient admitted to using cocaine one month ago as the last occurrence.   IMPRESSION:  1. Asthmatic bronchitis exacerbation.  2. Chronic  bronchitis.  3. Tobacco dependence two packs per day.  4. Cocaine usage.  5. Status post Pneumococcal pneumonia.  HIV is negative.   RECOMMENDATIONS:  1. Continue two liters of nasal 02 continuously.  2. Albuterol nebulizers 2.5 mg in 3 mL q.4h while awake.  3. Intravenous Solu-Medrol 125 mg IV q.8h times three doses only and then     decrease to 62.5 mg IV q.8h times three doses only.  4. Pulse oximetry q.4h.  5. Continue Rocephin 1 gram IV q.24h as ordered.  6. Obtain sputum culture and sensitivity.  7. Observe for withdrawal symptoms of cocaine and possibly other substances.  8. Continue Zithromax 500 mg IV daily as ordered.  9. Continue Advair Diskus one puff q.12h.  10.      I will follow closely and will make further recommendations as data     base expands.      ___________________________________________                                            Melvyn Novas, MD   RMD/MEDQ  D:  09/19/2003  T:  09/20/2003  Job:  [id number]   cc:   Tilda Burrow, M.D.  [address] [place]  [place] [address]  Fax: [phone]

## 2010-09-03 NOTE — H&P (Signed)
NAME:  [name], [name]                         ACCOUNT NO.:  [id number]   MEDICAL RECORD NO.:  [id number]                   PATIENT TYPE:  INP   LOCATION:  IC03                                 FACILITY:  [facility]   PHYSICIAN:  Vania Rea, M.D.              DATE OF BIRTH:  [birth year]   DATE OF ADMISSION:  08/03/2003  DATE OF DISCHARGE:                                HISTORY & PHYSICAL   PRIMARY CARE PHYSICIAN:  Unassigned.   OBSTETRICIAN:  Tilda Burrow, M.D./Luther Lauretta Chester, M.D.   CHIEF COMPLAINT:  Sudden onset of shortness of breath, chest pain, and  weakness since yesterday.   HISTORY OF PRESENT ILLNESS:  This is a 40 year old Caucasian lady with a  history of asthma who was in quite good health until the sudden onset of  severe chest pain, shortness of breath and a cough productive of yellow  sputum yesterday.  She has been taking over-the-counter NSAIDS without much  benefit and eventually was persuaded to come to the emergency room this  morning. She has noted no fever, and her predominant complaint is severe  pain.  She does smoke 1 pack per day for many years and she does have a  history of crack use, last usage unknown.   PAST MEDICAL HISTORY:  Asthma, denies previous pneumonia.   PAST SURGICAL HISTORY:  D&Cs for miscarriage.  She has had 5 spontaneous  vaginal deliveries and one of her children has died with SIDS.   ALLERGIES:  PENICILLIN and SEASONAL allergies.  PENICILLIN causes shortness  of breath, hives, and a rash.   SOCIAL HISTORY:  She is unmarried.  Lives with he boyfriend of 3 years.  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  She has 5 children, 1 died of SIDS, 4  alive and healthy.   FAMILY HISTORY:  Her family history is significant for diabetes and colon  cancer.  She smokes 1 pack a day.  Denies any significant alcohol use.  Does  have a history of crack usage.  Last usage unknown, the patient is not very  cooperative with this.  She is very  distressed by pain at this time. She has  been given an injection of Toradol.   REVIEW OF SYSTEMS:  Denies headache, sinusitis, eye problems, thyroid  problems, diabetes.  Is having severe right lower chest pain with shortness  of breath.  She has been vomiting.  Vomitus contains a clear liquid, no  blood.  There is no weight loss, no abdominal pain, no jaundice.  No  frequency, dysuria, or hematuria.  No joint pains.   MENSTRUAL HISTORY:  Her last menstrual period was January 8th.  No history  of depression, anxiety or bipolar disorder.   PHYSICAL EXAMINATION:  GENERAL:  This is a young, Caucasian lady lying in  the left lateral position severely distressed and hypoxic on the monitor.  Repeatedly complaining of pain  and does not want to keep the face mask on  because it is very uncomfortable.  VITAL SIGNS:  Temperature is 98.5 orally.  Her blood pressure is 82/40.  Her  pulse is 105.  Respirations 24, saturating at 91% on a 50% face mask.  HEENT:  Her pupils are round, equal and reactive to light.  She is pink and  anicteric.  She seems mildly dehydrated.  NECK:  She has no cervical lymphadenopathy.  No thyromegaly.  CHEST:  Diffuse expiratory wheezing bilaterally.  ABDOMEN:  Her abdomen is soft and nontender.  Because of her position it is  difficult to examine the lower abdomen although she gives a history of 3-4  months pregnancy.  EXTREMITIES:  Her extremities are warm.  There is no edema.  She has 3+  pulses bilaterally.  CENTRAL NERVOUS SYSTEM:  She seems mentally very distressed, but she is  alert and oriented x3.  There are no focal neurological deficits.   LABS:  Her white count is 14.7 and her hemoglobin 11.1, hematocrit 31.6, RDW  14.9, her platelets are 237, neutrophils 96%.  Her sodium is 130, potassium  3.1, chloride 103, CO2 23, glucose 99, BUN 9, creatinine 0.6, calcium 7.1.  Total protein is 4.6, albumin 2.2, AST 13, ALT 10, alkaline phosphatase 44,  total  bilirubin 0.7, indirect 0.6.  A urine pregnancy test is positive and a  urinalysis shows specific gravity of 1.030, moderate amount of hemoglobin,  trace of ketones, negative nitrites and leukocyte esterase.  Microscopy  shows 3-6 white cells, 7-10 red cells and rare bacteria.  Chest x-ray shows  a rounded opacity __________ upper segments of the right lower lobe.   ASSESSMENT:  1. Probably right lower lobe pneumonia.  2. Dehydration and hypotension.  3. Three months' pregnancy.  4. Severe right-side pleurisy.  5. Asthma.   PLAN:  We will bolus her with another liter of normal saline.  She has  already received 2 liters.  We will give her 8 more milligrams of potassium  in addition to the 4 she has already received.  We will give ours over 8  hours orally.  We will keep her IV fluid going quite rapidly with  supplemental potassium.  Because of her PENICILLIN allergy, the emergency  room physician did discuss with infectious disease serve and we agree with  continuing vancomycin and Zithromax as a combination of choice.   Pain management has been discussed with Dr. Despina Hidden, the obstetrician, and he  has agreed that Toradol is safe for her. We will give her Toradol for her  pleurisy and we will give her supplemental morphine.  The patient is  significantly hypoxic.  We will admit her to the intensive care unit.  We  have explained to her that she may eventually need intubation if she is not  improving and she has agreed.  She is a full code.  We will increase her  oxygen, for the time being, to 100% nonrebreather.  We will also give her  Solu-Medrol and DuoNeb every 3 hours.     ___________________________________________                                         Vania Rea, M.D.   LC/MEDQ  D:  08/03/2003  T:  08/03/2003  Job:  [id number]

## 2010-09-03 NOTE — Op Note (Signed)
NAME:  [name], [name]                            ACCOUNT NO.:  [id number]   MEDICAL RECORD NO.:  [id number]                   PATIENT TYPE:  INP   LOCATION:  A427                                 FACILITY:  [facility]   PHYSICIAN:  Tilda Burrow, M.D.              DATE OF BIRTH:  [birth year]   DATE OF PROCEDURE:  12/21/2003  DATE OF DISCHARGE:                                 OPERATIVE REPORT   PREOPERATIVE DIAGNOSIS:  Pregnancy, 39 weeks, prior cesarean section, now  for trial of labor, elective sterilization, limited prenatal care.   POSTOPERATIVE DIAGNOSIS:  Pregnancy, 39 weeks, prior cesarean section, now  for trial of labor, elective sterilization, limited prenatal care.   PROCEDURE:  Repeat low transverse cesarean section, bilateral tubal  ligation.   SURGEON:  Tilda Burrow, M.D.   ASSISTANTAsencion Noble, CST-FA.   ANESTHESIA:  Spinal, Nelda Severe, CRNA.   COMPLICATIONS:  None.   FINDINGS:  Virtual abscess of right rectus muscles, 6 pound 14.4 ounce female  infant, Apgars 9 and 9, thick lower uterine segment.   INDICATIONS:  A 40 year old gravida 8, para 5, AB 2 at 39 weeks by consensus  criteria with almost no prenatal care, not seen in our office since  discharge September 18, 2003 for severe pneumonia, who has an extensive history of  substance abuse including early pregnancy who claims not to have used in the  last 2 weeks. She was in the hospital with her other child and had membrane  rupture. She was presented to labor and delivery where membrane rupture was  confirmed with nitrazine fluid, and therefore, she was taken to Cesarean  section as necessary given her history of prior Cesarean section.   DETAILS OF PROCEDURE:  The patient was taken to the operating room. Spinal  anesthesia introduced by Mr. Nelda Severe, CRNA, and transverse low abdominal  incision performed in standard fashion. The incision was noteworthy in that  once the fascia was opened we were actually looking at the  bladder. There  was no rectus muscles on the right side that could be identified for  distance of approximately 10 cm from the midline. The left side had standard  rectus muscles and normal fascia. Bladder flap was developed anteriorly, and  transverse uterine incision made through a quite thickened lower uterine  segment. There was no thinned out area from the prior Cesarean section. We  proceeded with transverse extension of the lower uterine segment incision,  delivered the infant through the incision after making a 2-cm vertical T cut  on the uterine incision. We then developed the infant 6 pounds 14.4 ounces,  Apgars 9 and 9, with care administered by Dr. Milinda Cave whose notes are  elsewhere.   The patient then had cord blood samples obtained. Cord had very thick cord  that was quite short; three-vessel cord confirmed. Central location of the  cord insertion was noted. There  was no malodor to the amniotic fluid. Para March  presentation of the placenta upon delivery was noted, and the membranes were  delivered intact. Due to penicillin allergy, antibiotic irrigation was not  possible, so saline irrigation was used followed by single layer of running  locking closure. Hemostasis was good. Two-0 chromic bladder flap  reapproximation followed.   Tubal ligation followed involving double ligation around the mid segment  __________tube on either side with clamping, cutting, and suture ligating,  with excision of the mid segment __________ tube for histology.   The patient then had closure of the anterior abdominal wall with 2-0  chromic, 0 Vicryl closing the fascial layer. We did note some somewhat  thickened muscle tissue at the lateral aspects of the Pfannenstiel fascial  incision. This may have simply represented a rectus diastasis that was  unilateral. Closure of the muscle layer was followed by 2-0 chromic closure  of subcu fatty tissue and staple closure of the skin. The patient  tolerated  the procedure well with 500 cc blood loss.      ___________________________________________                                            Tilda Burrow, M.D.   JVF/MEDQ  D:  12/21/2003  T:  12/22/2003  Job:  [id number]   cc:   Jeoffrey Massed, M.D.  [address]  [place]  [place] [address]  Fax: [phone]

## 2010-09-03 NOTE — Discharge Summary (Signed)
NAME:  [name], [name]                            ACCOUNT NO.:  [id number]   MEDICAL RECORD NO.:  [id number]                   PATIENT TYPE:  INP   LOCATION:  A427                                 FACILITY:  [facility]   PHYSICIAN:  Tilda Burrow, M.D.              DATE OF BIRTH:  10/13/1970   DATE OF ADMISSION:  12/21/2003  DATE OF DISCHARGE:  12/23/2003                                 DISCHARGE SUMMARY   ADMITTING DIAGNOSES:  1.  Pregnancy 39 weeks.  2.  Minimal prenatal care.  3.  Spontaneous rupture of membranes without labor.  4.  History of substance abuse.   DISCHARGE DIAGNOSES:  1.  Pregnancy [redacted] weeks gestation, delivered.  2.  Minimal prenatal care.  3.  Spontaneous rupture of membranes without labor.  4.  Prior cesarean section not for trial of labor.  5.  Elective sterilization.  6.  Continued cocaine use.   DISCHARGE MEDICATION:  Darvocet-N 100 one to two q.4h. p.r.n. pain.   FOLLOW UP:  Followup 1 week for staple removal.   HOSPITAL SUMMARY:  This 39 year old female gravida 8, para 5, AB 2 with  limited prenatal care is admitted after virtually no outpatient contact.  She had been caring for her child who was admitted downstairs on the third  floor when she had experienced membrane rupture.  She has not kept followup  appointments after hospitalization June 2005 for severe recurrent bronchitis  and pneumonia at [redacted] weeks gestation.  She is 39 weeks at the time she  presents where membrane rupture is confirmed by speculum check and puddle  positive amniotic fluid confirmed per vagina.   PAST MEDICAL HISTORY:  Positive for chronic smoker with two packs a day  cigarette use, suboptimal blood gases proven in the past.   PAST OBSTETRICAL HISTORY:  Five spontaneous vaginal deliveries with one  cesarean section.  Patient requests permanent sterilization as part of  procedure.   HOSPITAL COURSE:  Patient was admitted with hemoglobin 11.7, hematocrit  34.5, urine drug screen  was again positive for cocaine, patient had alleged  that she had not used in 2 weeks which makes her credibility once again  suspect.  C-section and tubal ligation was uncomplicated delivering a  healthy-appearing infant Apgars 9/9.  Postpartum course was uneventful.  Maternal blood type confirmed as A  positive with antibody screen negative.  Postpartum hematocrit returned at  11.5 and 33.4 postop.  She did excellent and was discharged home in 2 days  on December 23, 2003 for followup in 1 week for staple removal and routine  postpartum care.     ___________________________________________                                         Tilda Burrow, M.D.   JVF/MEDQ  D:  01/01/2004  T:  01/01/2004  Job:  [id number]   cc:   Jeoffrey Massed, M.D.  [address]  [place]  [place] [address]  Fax: [phone]

## 2010-10-28 ENCOUNTER — Other Ambulatory Visit: Payer: Self-pay | Admitting: *Deleted

## 2010-10-28 MED ORDER — BUPROPION HCL ER (SR) 150 MG PO TB12: 150.0000 mg | ORAL_TABLET | Freq: Two times a day (BID) | ORAL | Status: DC

## 2010-11-30 ENCOUNTER — Ambulatory Visit: Payer: Medicaid - Out of State | Admitting: Emergency Medicine

## 2010-12-07 ENCOUNTER — Encounter: Payer: Self-pay | Admitting: Emergency Medicine

## 2010-12-07 ENCOUNTER — Ambulatory Visit (INDEPENDENT_AMBULATORY_CARE_PROVIDER_SITE_OTHER): Payer: Medicaid - Out of State | Admitting: Emergency Medicine

## 2010-12-07 VITALS — BP 120/74 | HR 109 | Temp 97.7°F | Ht 64.0 in | Wt 147.4 lb

## 2010-12-07 DIAGNOSIS — J449 Chronic obstructive pulmonary disease, unspecified: Secondary | ICD-10-CM

## 2010-12-07 NOTE — Progress Notes (Signed)
  Subjective:    Patient ID: [name], female    DOB: 04/29/1970, 40 y.o.   MRN: [id number]  HPI  40 yo smoker who was admitted to [facility] 9/25 - 10/12 for bilateral MSSA cavitary PNA's c/b L empyema, VDRF. She underwent L CT placement and then a second anterior pigtail to drain residual hydropneumothorax. She went home on IV Ancef and finished 6 weeks of therapy. Pigtail was d/c'd on 02/05/08. Followed now for COPD, tobacco use.   ROV 05/29/09 -- Acute visit. I treated her for an apparent exacerbation with Pred and doxy by phone 2/7. Here today to report chest tightness, fatigue, poor by mouth intake, fevers, some chills. Coughing greenish phlegm. Off chantix. Restarted cigarettes 3 weeks ago, 5cig a day. Doesn't feel the Pred has helped her, if anything her breathing is tighter. On 40mg  by mouth once daily currently   ROV 07/07/09 -- Returns for f/u. Off prednisone. Tells me that she is about the same. Her breathing is still limited with exertion. She has noticed some HA's. Also having some periods of heart fluttering associated with some lightheadedness. She doesn't necessarily relate these to her bronchodilator. Wheezing and cough are still problems - unchanged. Still smoking 1/2 pk a day.   ROV 10/05/09 -- regular f/u COPD. Since last visit has had high stress, has lost some wt. Her EtOH use and her cigarette use have both gone up, now 2pks/day. Stopped Spiriva and Symbicort 1 month ago. Her breathing worsened slightly, but she feels she has tolerated being off. Her Ventolin use has increased some. No exacerbations since last visit.   ROV 03/19/10 -- COPD, tobacco use. Still smoking 1 pk/day. Having SOB with both exertion and sometimes at rest. No fevers, coughs frequently, yellow mucous, no hemoptysis. Describes a pain in L chest below L breast, has moved to flank and L back, notices with deep breath.   ROV 08/27/10 -- COPD, tobacco abuse (1 to 1.5 pk/day), hx MSSA cavitary PNA in 2009. Returns for  F/u. Since last visit she tried Wellbutrin, but stopped it due to labile mood. Remains on Spiriva + Symbicort, has been using ventolin more frequently; has been using 7-8 x a day.   ROV 12/07/10 -- COPD, continued tobacco, hx MSSA cavitary PNA '09. She has cutting her smoking way down to 0.5 pk/day. She is having trouble with GERD and is being waked up at night coughing.  She is having some wheezing. Able to exert and do her usual activities.  Neb use -    Review of Systems As above    Objective:   Physical Exam Gen: Pleasant, well-nourished, in no distress,  normal affect  ENT: No lesions,  mouth clear,  oropharynx clear, no postnasal drip  Neck: No JVD, no TMG, no carotid bruits  Lungs: distant, coarse B, soft exp wheeze  Cardiovascular: RRR, heart sounds normal, no murmur or gallops, no peripheral edema  Musculoskeletal: No deformities, no cyanosis or clubbing  Neuro: alert, non focal  Skin: Warm, no lesions or rashes      Assessment & Plan:  COPD Continue your same inhaled medications Congratulations on cutting down your smoking! Keep working on stopping them. We will get an new nebulizer.  Get your flu shot in the Fall.  Follow up with Dr Delton Coombes in 4 months or sooner if you have any problems.

## 2010-12-07 NOTE — Patient Instructions (Signed)
Continue your same inhaled medications Congratulations on cutting down your smoking! Keep working on stopping them. We will get an new nebulizer.  Get your flu shot in the Fall.  Follow up with Dr Parthenia Tellefsen in 4 months or sooner if you have any problems.  

## 2010-12-07 NOTE — Assessment & Plan Note (Signed)
Continue your same inhaled medications Congratulations on cutting down your smoking! Keep working on stopping them. We will get an new nebulizer.  Get your flu shot in the Fall.  Follow up with Dr Delton Coombes in 4 months or sooner if you have any problems.

## 2011-01-01 ENCOUNTER — Other Ambulatory Visit: Payer: Self-pay | Admitting: Emergency Medicine

## 2011-01-17 LAB — RAPID URINE DRUG SCREEN, HOSP PERFORMED
Amphetamines: NOT DETECTED
Amphetamines: NOT DETECTED
Barbiturates: NOT DETECTED
Barbiturates: NOT DETECTED
Benzodiazepines: NOT DETECTED
Benzodiazepines: POSITIVE — AB
Cocaine: NOT DETECTED
Opiates: POSITIVE — AB
Tetrahydrocannabinol: NOT DETECTED
Tetrahydrocannabinol: NOT DETECTED

## 2011-01-17 LAB — GLUCOSE, CAPILLARY
Glucose-Capillary: 178 — ABNORMAL HIGH
Glucose-Capillary: 229 — ABNORMAL HIGH
Glucose-Capillary: 100 — ABNORMAL HIGH
Glucose-Capillary: 107 — ABNORMAL HIGH
Glucose-Capillary: 107 — ABNORMAL HIGH
Glucose-Capillary: 112 — ABNORMAL HIGH
Glucose-Capillary: 113 — ABNORMAL HIGH
Glucose-Capillary: 115 — ABNORMAL HIGH
Glucose-Capillary: 117 — ABNORMAL HIGH
Glucose-Capillary: 127 — ABNORMAL HIGH
Glucose-Capillary: 135 — ABNORMAL HIGH
Glucose-Capillary: 135 — ABNORMAL HIGH
Glucose-Capillary: 139 — ABNORMAL HIGH
Glucose-Capillary: 156 — ABNORMAL HIGH
Glucose-Capillary: 159 — ABNORMAL HIGH
Glucose-Capillary: 63 — ABNORMAL LOW
Glucose-Capillary: 81
Glucose-Capillary: 84
Glucose-Capillary: 96

## 2011-01-17 LAB — BLOOD GAS, ARTERIAL
Acid-Base Excess: 1.1
Acid-Base Excess: 1.4
Acid-Base Excess: 8.5 — ABNORMAL HIGH
Acid-Base Excess: 9.2 — ABNORMAL HIGH
Acid-base deficit: 1.1
Bicarbonate: 25 — ABNORMAL HIGH
Bicarbonate: 26 — ABNORMAL HIGH
Bicarbonate: 24.6 — ABNORMAL HIGH
Bicarbonate: 26.5 — ABNORMAL HIGH
Bicarbonate: 33.1 — ABNORMAL HIGH
Bicarbonate: 34.8 — ABNORMAL HIGH
FIO2: 100
FIO2: 0.4
FIO2: 0.5
FIO2: 0.5
FIO2: 0.5
MECHVT: 500
MECHVT: 500
O2 Saturation: 99.1
O2 Saturation: 92.8
O2 Saturation: 93.7
O2 Saturation: 98.1
O2 Saturation: 98.2
Patient temperature: 37
Patient temperature: 97.4
Patient temperature: 98.6
RATE: 18
RATE: 12
TCO2: 23.6
TCO2: 28
TCO2: 34.7
TCO2: 36.7
pCO2 arterial: 38.5
pCO2 arterial: 62.5
pH, Arterial: 7.372
pH, Arterial: 7.427 — ABNORMAL HIGH
pO2, Arterial: 229 — ABNORMAL HIGH
pO2, Arterial: 113 — ABNORMAL HIGH
pO2, Arterial: 115 — ABNORMAL HIGH
pO2, Arterial: 77.5 — ABNORMAL LOW

## 2011-01-17 LAB — FERRITIN: Ferritin: 436 — ABNORMAL HIGH (ref 10–291)

## 2011-01-17 LAB — URINALYSIS, ROUTINE W REFLEX MICROSCOPIC
Bilirubin Urine: NEGATIVE
Glucose, UA: NEGATIVE
Hgb urine dipstick: NEGATIVE
Specific Gravity, Urine: <1.005 — ABNORMAL LOW
pH: 5.5

## 2011-01-17 LAB — CBC
HCT: 32.6 — ABNORMAL LOW
HCT: 35.4 — ABNORMAL LOW
HCT: 37.4
HCT: 24.9 — ABNORMAL LOW
HCT: 25.5 — ABNORMAL LOW
HCT: 27.5 — ABNORMAL LOW
HCT: 29.7 — ABNORMAL LOW
Hemoglobin: 11.8 — ABNORMAL LOW
Hemoglobin: 12.2
Hemoglobin: 9.3 — ABNORMAL LOW
Hemoglobin: 8.9 — ABNORMAL LOW
Hemoglobin: 9 — ABNORMAL LOW
Hemoglobin: 9.8 — ABNORMAL LOW
MCHC: 33.1
MCHC: 33.2
MCHC: 33.4
MCHC: 32.2
MCHC: 32.6
MCHC: 32.7
MCV: 89.3
MCV: 89.6
MCV: 86.7
MCV: 88.9
MCV: 89.1
Platelets: 301
Platelets: 418 — ABNORMAL HIGH
Platelets: 324
Platelets: 342
Platelets: 362
RBC: 3.14 — ABNORMAL LOW
RBC: 4.18
RBC: 3.09 — ABNORMAL LOW
RBC: 3.35 — ABNORMAL LOW
RDW: 17.1 — ABNORMAL HIGH
RDW: 18.4 — ABNORMAL HIGH
RDW: 18.5 — ABNORMAL HIGH
RDW: 18.8 — ABNORMAL HIGH
RDW: 18.9 — ABNORMAL HIGH
WBC: 11.6 — ABNORMAL HIGH
WBC: 15.3 — ABNORMAL HIGH
WBC: 17.9 — ABNORMAL HIGH
WBC: 20.6 — ABNORMAL HIGH

## 2011-01-17 LAB — BODY FLUID CELL COUNT WITH DIFFERENTIAL
Lymphs, Fluid: 5
Monocyte-Macrophage-Serous Fluid: 1 — ABNORMAL LOW

## 2011-01-17 LAB — BASIC METABOLIC PANEL
BUN: 11
BUN: 8
BUN: 9
BUN: 10
BUN: 7
CO2: 27
CO2: 32
CO2: 34 — ABNORMAL HIGH
Calcium: 8.4
Calcium: 8.7
Calcium: 7.9 — ABNORMAL LOW
Chloride: 101
Chloride: 101
Chloride: 105
Chloride: 96
Creatinine, Ser: 0.45
Creatinine, Ser: 0.33 — ABNORMAL LOW
Creatinine, Ser: 0.34 — ABNORMAL LOW
Creatinine, Ser: 0.37 — ABNORMAL LOW
GFR calc Af Amer: >60
GFR calc Af Amer: >60
GFR calc Af Amer: >60
GFR calc non Af Amer: >60
GFR calc non Af Amer: >60
GFR calc non Af Amer: >60
GFR calc non Af Amer: >60
GFR calc non Af Amer: >60
GFR calc non Af Amer: >60
Glucose, Bld: 260 — ABNORMAL HIGH
Glucose, Bld: 110 — ABNORMAL HIGH
Glucose, Bld: 160 — ABNORMAL HIGH
Potassium: 3.4 — ABNORMAL LOW
Potassium: 3.5
Potassium: 2.8 — ABNORMAL LOW
Potassium: 3.5
Potassium: 3.6
Sodium: 134 — ABNORMAL LOW
Sodium: 136
Sodium: 137
Sodium: 129 — ABNORMAL LOW

## 2011-01-17 LAB — GLUCOSE, SEROUS FLUID: Glucose, Fluid: 73

## 2011-01-17 LAB — COMPREHENSIVE METABOLIC PANEL
ALT: 25
ALT: 25
ALT: 50 — ABNORMAL HIGH
AST: 17
Albumin: 1.5 — ABNORMAL LOW
BUN: 9
CO2: 25
CO2: 37 — ABNORMAL HIGH
Calcium: 6.6 — ABNORMAL LOW
Calcium: 8 — ABNORMAL LOW
Calcium: 8.2 — ABNORMAL LOW
Creatinine, Ser: 0.31 — ABNORMAL LOW
Creatinine, Ser: 0.64
GFR calc Af Amer: >60
GFR calc Af Amer: >60
GFR calc non Af Amer: >60
GFR calc non Af Amer: >60
Glucose, Bld: 122 — ABNORMAL HIGH
Glucose, Bld: 98
Sodium: 155 — ABNORMAL HIGH
Sodium: 156 — ABNORMAL HIGH
Total Protein: 5.1 — ABNORMAL LOW
Total Protein: 5.1 — ABNORMAL LOW

## 2011-01-17 LAB — TYPE AND SCREEN

## 2011-01-17 LAB — CARBOXYHEMOGLOBIN
Carboxyhemoglobin: 0.6
Carboxyhemoglobin: 0.8
Methemoglobin: 1.4
O2 Saturation: 84.5

## 2011-01-17 LAB — CULTURE, BLOOD (ROUTINE X 2): Culture: NO GROWTH

## 2011-01-17 LAB — EAR CULTURE

## 2011-01-17 LAB — CULTURE, RESPIRATORY W GRAM STAIN

## 2011-01-17 LAB — IRON AND TIBC

## 2011-01-17 LAB — DIFFERENTIAL
Basophils Absolute: 0
Basophils Absolute: 0
Basophils Relative: 0
Basophils Relative: 0
Basophils Relative: 0
Eosinophils Absolute: 0
Eosinophils Relative: 0
Eosinophils Relative: 0
Eosinophils Relative: 0
Eosinophils Relative: 0
Eosinophils Relative: 0
Lymphocytes Relative: 4 — ABNORMAL LOW
Lymphocytes Relative: 7 — ABNORMAL LOW
Lymphocytes Relative: 6 — ABNORMAL LOW
Lymphs Abs: 0.6 — ABNORMAL LOW
Lymphs Abs: 0.8
Lymphs Abs: 0.9
Monocytes Absolute: 0.4
Monocytes Absolute: 0.8
Monocytes Absolute: 0.6
Monocytes Relative: 11
Monocytes Relative: 5
Monocytes Relative: 7
Monocytes Relative: 2 — ABNORMAL LOW
Neutro Abs: 14 — ABNORMAL HIGH
Neutro Abs: 28.2 — ABNORMAL HIGH
Neutrophils Relative %: 84 — ABNORMAL HIGH
Neutrophils Relative %: 91 — ABNORMAL HIGH
WBC Morphology: INCREASED

## 2011-01-17 LAB — VANCOMYCIN, RANDOM: Vancomycin Rm: 6.6

## 2011-01-17 LAB — LIPASE, BLOOD: Lipase: <10 — ABNORMAL LOW

## 2011-01-17 LAB — POCT I-STAT 3, ART BLOOD GAS (G3+)
Acid-Base Excess: 16 — ABNORMAL HIGH
Acid-Base Excess: 7 — ABNORMAL HIGH
O2 Saturation: 96
Patient temperature: 98.1
Patient temperature: 98.3
pH, Arterial: 7.412 — ABNORMAL HIGH

## 2011-01-17 LAB — HIV ANTIBODY (ROUTINE TESTING W REFLEX)
HIV: NONREACTIVE
HIV: NONREACTIVE

## 2011-01-17 LAB — WOUND CULTURE: Gram Stain: NONE SEEN

## 2011-01-17 LAB — PROTIME-INR: Prothrombin Time: 13.7

## 2011-01-17 LAB — CORTISOL: Cortisol, Plasma: 13.1

## 2011-01-17 LAB — PROTEIN, TOTAL: Total Protein: 4.9 — ABNORMAL LOW

## 2011-01-17 LAB — FOLATE: Folate: 4.8

## 2011-01-17 LAB — APTT
aPTT: 29
aPTT: 37

## 2011-01-17 LAB — AFB CULTURE WITH SMEAR (NOT AT ARMC): Acid Fast Smear: NONE SEEN

## 2011-01-17 LAB — POCT I-STAT, CHEM 8
Creatinine, Ser: 0.9
Glucose, Bld: 90
Hemoglobin: 14.3
Sodium: 130 — ABNORMAL LOW
TCO2: 24

## 2011-01-17 LAB — CULTURE, BAL-QUANTITATIVE W GRAM STAIN: Colony Count: >=100000

## 2011-01-17 LAB — HEPATIC FUNCTION PANEL
AST: 46 — ABNORMAL HIGH
Bilirubin, Direct: 0.3
Total Bilirubin: 0.5

## 2011-01-17 LAB — PROTEIN, BODY FLUID

## 2011-01-17 LAB — LACTIC ACID, PLASMA: Lactic Acid, Venous: 1.8

## 2011-01-17 LAB — BODY FLUID CULTURE

## 2011-01-17 LAB — MAGNESIUM
Magnesium: 1.7
Magnesium: 2.2

## 2011-01-18 LAB — GLUCOSE, CAPILLARY
Glucose-Capillary: 100 — ABNORMAL HIGH
Glucose-Capillary: 100 — ABNORMAL HIGH
Glucose-Capillary: 103 — ABNORMAL HIGH
Glucose-Capillary: 104 — ABNORMAL HIGH
Glucose-Capillary: 105 — ABNORMAL HIGH
Glucose-Capillary: 108 — ABNORMAL HIGH
Glucose-Capillary: 110 — ABNORMAL HIGH
Glucose-Capillary: 112 — ABNORMAL HIGH
Glucose-Capillary: 112 — ABNORMAL HIGH
Glucose-Capillary: 113 — ABNORMAL HIGH
Glucose-Capillary: 113 — ABNORMAL HIGH
Glucose-Capillary: 115 — ABNORMAL HIGH
Glucose-Capillary: 117 — ABNORMAL HIGH
Glucose-Capillary: 118 — ABNORMAL HIGH
Glucose-Capillary: 120 — ABNORMAL HIGH
Glucose-Capillary: 126 — ABNORMAL HIGH
Glucose-Capillary: 126 — ABNORMAL HIGH
Glucose-Capillary: 128 — ABNORMAL HIGH
Glucose-Capillary: 128 — ABNORMAL HIGH
Glucose-Capillary: 128 — ABNORMAL HIGH
Glucose-Capillary: 135 — ABNORMAL HIGH
Glucose-Capillary: 137 — ABNORMAL HIGH
Glucose-Capillary: 140 — ABNORMAL HIGH
Glucose-Capillary: 141 — ABNORMAL HIGH
Glucose-Capillary: 156 — ABNORMAL HIGH
Glucose-Capillary: 161 — ABNORMAL HIGH
Glucose-Capillary: 251 — ABNORMAL HIGH
Glucose-Capillary: 95
Glucose-Capillary: 95
Glucose-Capillary: 96
Glucose-Capillary: 99

## 2011-01-18 LAB — BASIC METABOLIC PANEL
BUN: 10
CO2: 28
CO2: 31
Calcium: 8.2 — ABNORMAL LOW
Calcium: 8.3 — ABNORMAL LOW
Chloride: 100
Chloride: 96
Creatinine, Ser: 0.4
Creatinine, Ser: 0.42
GFR calc Af Amer: >60
GFR calc non Af Amer: >60
Glucose, Bld: 113 — ABNORMAL HIGH
Glucose, Bld: 123 — ABNORMAL HIGH
Glucose, Bld: 93
Potassium: 3.7
Potassium: 3.9
Sodium: 133 — ABNORMAL LOW
Sodium: 135

## 2011-01-18 LAB — COMPREHENSIVE METABOLIC PANEL
AST: 19
Albumin: 1.7 — ABNORMAL LOW
Alkaline Phosphatase: 71
BUN: 17
Calcium: 8 — ABNORMAL LOW
Chloride: 94 — ABNORMAL LOW
Creatinine, Ser: 0.38 — ABNORMAL LOW
GFR calc Af Amer: >60
Glucose, Bld: 135 — ABNORMAL HIGH
Potassium: 3.7
Total Bilirubin: 0.4
Total Bilirubin: 0.6
Total Protein: 5 — ABNORMAL LOW
Total Protein: 5.2 — ABNORMAL LOW

## 2011-01-18 LAB — BLOOD GAS, ARTERIAL
Acid-Base Excess: 16.5 — ABNORMAL HIGH
Bicarbonate: 30.7 — ABNORMAL HIGH
Bicarbonate: 41.3 — ABNORMAL HIGH
FIO2: 0.5
MECHVT: 0.5
O2 Content: 2
O2 Saturation: 98.6
Patient temperature: 37
Patient temperature: 98.6
TCO2: 32
TCO2: 43
pCO2 arterial: 40.6
pH, Arterial: 7.491 — ABNORMAL HIGH
pO2, Arterial: 60.7 — ABNORMAL LOW

## 2011-01-18 LAB — PROTIME-INR
INR: 1.1
Prothrombin Time: 14.8

## 2011-01-18 LAB — FOLATE: Folate: 9.6

## 2011-01-18 LAB — POCT I-STAT 3, ART BLOOD GAS (G3+)
Acid-Base Excess: 16 — ABNORMAL HIGH
Acid-Base Excess: 19 — ABNORMAL HIGH
Acid-Base Excess: 23 — ABNORMAL HIGH
Bicarbonate: 45.2 — ABNORMAL HIGH
O2 Saturation: 97
O2 Saturation: 98
O2 Saturation: 99
Patient temperature: 98.6
Patient temperature: 98.6
TCO2: 49
pCO2 arterial: 59.6

## 2011-01-18 LAB — CBC
HCT: 24.5 — ABNORMAL LOW
HCT: 25.1 — ABNORMAL LOW
HCT: 26 — ABNORMAL LOW
HCT: 28 — ABNORMAL LOW
HCT: 30 — ABNORMAL LOW
Hemoglobin: 8 — ABNORMAL LOW
Hemoglobin: 8.1 — ABNORMAL LOW
Hemoglobin: 8.9 — ABNORMAL LOW
Hemoglobin: 9.5 — ABNORMAL LOW
Hemoglobin: 9.9 — ABNORMAL LOW
MCHC: 32.3
MCHC: 32.7
MCHC: 32.8
MCV: 88.5
MCV: 88.8
MCV: 90.3
Platelets: 305
Platelets: 360
Platelets: 643 — ABNORMAL HIGH
Platelets: 729 — ABNORMAL HIGH
RBC: 3.01 — ABNORMAL LOW
RBC: 3.02 — ABNORMAL LOW
RDW: 18.3 — ABNORMAL HIGH
RDW: 18.4 — ABNORMAL HIGH
RDW: 18.7 — ABNORMAL HIGH
RDW: 18.7 — ABNORMAL HIGH
RDW: 19.5 — ABNORMAL HIGH
RDW: 19.8 — ABNORMAL HIGH
WBC: 12.1 — ABNORMAL HIGH
WBC: 15 — ABNORMAL HIGH
WBC: 7.3
WBC: 8.8

## 2011-01-18 LAB — PHOSPHORUS
Phosphorus: 2.7
Phosphorus: 3.7
Phosphorus: 4
Phosphorus: 4.6

## 2011-01-18 LAB — IRON AND TIBC

## 2011-01-18 LAB — MAGNESIUM
Magnesium: 1.7
Magnesium: 2
Magnesium: 2.1

## 2011-01-18 LAB — VITAMIN B12: Vitamin B-12: 610 (ref 211–911)

## 2011-01-18 LAB — RETICULOCYTES
RBC.: 3.05 — ABNORMAL LOW
Retic Count, Absolute: 61
Retic Ct Pct: 2

## 2011-01-26 ENCOUNTER — Telehealth: Payer: Self-pay | Admitting: Emergency Medicine

## 2011-01-26 NOTE — Telephone Encounter (Signed)
Called number provided above - [phone] - [email or web address]

## 2011-01-27 NOTE — Telephone Encounter (Signed)
Spoke with pt. She states that after calling us 01/26/11, ended up going to her PCP and is now being treated for PNA. Nothing further needed now.

## 2011-01-28 LAB — COMPREHENSIVE METABOLIC PANEL
ALT: 18
Albumin: 2.8 — ABNORMAL LOW
Alkaline Phosphatase: 64
Chloride: 104
Glucose, Bld: 196 — ABNORMAL HIGH
Potassium: 3.4 — ABNORMAL LOW
Sodium: 138
Total Bilirubin: 0.2 — ABNORMAL LOW
Total Protein: 6.1

## 2011-01-28 LAB — EXPECTORATED SPUTUM ASSESSMENT W GRAM STAIN, RFLX TO RESP C

## 2011-01-28 LAB — DIFFERENTIAL
Basophils Absolute: 0
Basophils Absolute: 0
Basophils Absolute: 0
Basophils Relative: 0
Eosinophils Absolute: 0
Eosinophils Absolute: 0
Eosinophils Relative: 0
Eosinophils Relative: 0
Lymphocytes Relative: 11 — ABNORMAL LOW
Lymphs Abs: 1.4
Monocytes Absolute: 0.1 — ABNORMAL LOW
Monocytes Relative: 2 — ABNORMAL LOW
Neutro Abs: 10 — ABNORMAL HIGH
Neutrophils Relative %: 80 — ABNORMAL HIGH
Neutrophils Relative %: 86 — ABNORMAL HIGH

## 2011-01-28 LAB — CULTURE, BLOOD (ROUTINE X 2): Culture: NO GROWTH

## 2011-01-28 LAB — CBC
HCT: 31.7 — ABNORMAL LOW
HCT: 32.9 — ABNORMAL LOW
Hemoglobin: 10.4 — ABNORMAL LOW
MCHC: 33.3
MCV: 83.3
Platelets: 268
Platelets: 284
Platelets: 296
RDW: 17.6 — ABNORMAL HIGH
RDW: 17.8 — ABNORMAL HIGH
RDW: 17.9 — ABNORMAL HIGH
WBC: 10.4
WBC: 7.9

## 2011-01-28 LAB — CULTURE, RESPIRATORY W GRAM STAIN

## 2011-01-28 LAB — URINALYSIS, ROUTINE W REFLEX MICROSCOPIC
Bilirubin Urine: NEGATIVE
Glucose, UA: >1000 — AB
Hgb urine dipstick: NEGATIVE
Ketones, ur: NEGATIVE
Specific Gravity, Urine: <1.005 — ABNORMAL LOW
pH: 6.5

## 2011-01-28 LAB — URINE MICROSCOPIC-ADD ON

## 2011-01-28 LAB — BASIC METABOLIC PANEL
BUN: 9
Calcium: 8.8
Creatinine, Ser: 0.48
GFR calc non Af Amer: >60
Glucose, Bld: 185 — ABNORMAL HIGH

## 2011-01-28 LAB — D-DIMER, QUANTITATIVE: D-Dimer, Quant: 0.52 — ABNORMAL HIGH

## 2011-01-28 LAB — HEMOGLOBIN A1C
Hgb A1c MFr Bld: 5.9
Mean Plasma Glucose: 133

## 2011-01-28 LAB — PROTIME-INR
INR: 1.1
Prothrombin Time: 14.5

## 2011-01-28 LAB — APTT: aPTT: 39 — ABNORMAL HIGH

## 2011-01-28 LAB — URINE CULTURE: Colony Count: 2000

## 2011-01-31 ENCOUNTER — Telehealth: Payer: Self-pay | Admitting: Emergency Medicine

## 2011-01-31 NOTE — Telephone Encounter (Signed)
LMOMTCB x 1 

## 2011-01-31 NOTE — Telephone Encounter (Signed)
Pt seen on Fri., 10/12 in the ER at [facility] , diagnosed with pneumonia and refused admission. She was sent there by ambulance after seeing her PCP in his office and O2 sats were low. She is calling wanting abx, Prednisone and cough syrup called in. I advised that she would need to be seen and she states she has no way of getting here. Pt then states she would need to call us back after talking with her spouse. Will await pt call.

## 2011-02-01 NOTE — Telephone Encounter (Signed)
Called, spoke with pt to follow up on this.  States she was seen by PCP yesterday afternoon and was told she still has pna.  States she was given abx, prednisone, and cough syrup.  Pt states she is able to walk more today and does not need anything further from our office at this time.  I advised pt if she does need anything to call back and if symptoms do not get better or get worse to call back or seek emergency care at ED/UC.  She verbalized understanding of this.

## 2011-02-22 ENCOUNTER — Ambulatory Visit (INDEPENDENT_AMBULATORY_CARE_PROVIDER_SITE_OTHER): Payer: PRIVATE HEALTH INSURANCE | Admitting: Emergency Medicine

## 2011-02-22 ENCOUNTER — Ambulatory Visit (INDEPENDENT_AMBULATORY_CARE_PROVIDER_SITE_OTHER)
Admission: RE | Admit: 2011-02-22 | Discharge: 2011-02-22 | Disposition: A | Discharge status: Home / Self Care | Payer: PRIVATE HEALTH INSURANCE | Source: Ambulatory Visit | Attending: Emergency Medicine | Admitting: Emergency Medicine

## 2011-02-22 ENCOUNTER — Encounter: Payer: Self-pay | Admitting: Emergency Medicine

## 2011-02-22 VITALS — BP 118/60 | HR 113 | Temp 98.9°F | Ht 64.0 in | Wt 154.0 lb

## 2011-02-22 DIAGNOSIS — J4489 Other specified chronic obstructive pulmonary disease: Secondary | ICD-10-CM

## 2011-02-22 DIAGNOSIS — J449 Chronic obstructive pulmonary disease, unspecified: Secondary | ICD-10-CM

## 2011-02-22 NOTE — Patient Instructions (Signed)
CXR today Please continue your current inhaled medications.  Continue to work on stopping your cigarettes Follow with Dr Delton Coombes in 3 months or sooner if you have any problems.
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
## 2011-02-22 NOTE — Assessment & Plan Note (Signed)
Frequent exacerbations with a recent flare +/- PNA - continue same BD regimen - discussed tobacco cessation in detail - CXR today - rov 3 months

## 2011-02-24 ENCOUNTER — Other Ambulatory Visit: Payer: Self-pay | Admitting: Emergency Medicine

## 2011-02-24 ENCOUNTER — Telehealth: Payer: Self-pay | Admitting: Emergency Medicine

## 2011-02-24 MED ORDER — TIOTROPIUM BROMIDE MONOHYDRATE 18 MCG IN CAPS: 18.0000 ug | ORAL_CAPSULE | Freq: Every day | RESPIRATORY_TRACT | Status: DC

## 2011-02-24 NOTE — Telephone Encounter (Signed)
Refill sent. Pt aware.Jasin Brazel, CMA  

## 2011-05-02 ENCOUNTER — Other Ambulatory Visit: Payer: Self-pay | Admitting: Emergency Medicine

## 2011-05-11 ENCOUNTER — Other Ambulatory Visit: Payer: Self-pay | Admitting: Emergency Medicine

## 2011-05-16 ENCOUNTER — Encounter: Payer: Self-pay | Admitting: Emergency Medicine

## 2011-05-16 ENCOUNTER — Ambulatory Visit (INDEPENDENT_AMBULATORY_CARE_PROVIDER_SITE_OTHER)
Admission: RE | Admit: 2011-05-16 | Discharge: 2011-05-16 | Disposition: A | Discharge status: Home / Self Care | Payer: PRIVATE HEALTH INSURANCE | Source: Ambulatory Visit | Attending: Emergency Medicine | Admitting: Emergency Medicine

## 2011-05-16 ENCOUNTER — Ambulatory Visit (INDEPENDENT_AMBULATORY_CARE_PROVIDER_SITE_OTHER): Payer: PRIVATE HEALTH INSURANCE | Admitting: Emergency Medicine

## 2011-05-16 ENCOUNTER — Other Ambulatory Visit (INDEPENDENT_AMBULATORY_CARE_PROVIDER_SITE_OTHER): Payer: PRIVATE HEALTH INSURANCE

## 2011-05-16 DIAGNOSIS — J449 Chronic obstructive pulmonary disease, unspecified: Secondary | ICD-10-CM

## 2011-05-16 DIAGNOSIS — K625 Hemorrhage of anus and rectum: Secondary | ICD-10-CM

## 2011-05-16 DIAGNOSIS — F17200 Nicotine dependence, unspecified, uncomplicated: Secondary | ICD-10-CM

## 2011-05-16 HISTORY — DX: Hemorrhage of anus and rectum: K62.5

## 2011-05-16 LAB — CBC
HCT: 39.6 % (ref 36.0–46.0)
Hemoglobin: 13.3 g/dL (ref 12.0–15.0)
MCHC: 33.5 g/dL (ref 30.0–36.0)
MCV: 94 fl (ref 78.0–100.0)
Platelets: 309 10*3/uL (ref 150.0–400.0)

## 2011-05-16 MED ORDER — AZITHROMYCIN 250 MG PO TABS: ORAL_TABLET | ORAL | Status: AC

## 2011-05-16 MED ORDER — PREDNISONE 10 MG PO TABS
ORAL_TABLET | ORAL | Status: DC
Start: 2011-05-16 — End: 2011-11-21

## 2011-05-16 NOTE — Assessment & Plan Note (Addendum)
Will refer to gastroenterology Check CBC today

## 2011-05-16 NOTE — Patient Instructions (Signed)
Take prednisone as directed Take azithromycin as directed Continue your inhaled medications as you have been taking them Continue to work on stopping smoking We will check blood work today We will check a chest x-ray today We will refer you to see gastroenterology Followup with Dr. Delton Coombes in one month

## 2011-05-16 NOTE — Assessment & Plan Note (Signed)
With acute exacerbation - Pred + azithro - cxr today given her hx necrotizing PNA and lung injury - rov 1 month to assess improvement

## 2011-05-16 NOTE — Assessment & Plan Note (Signed)
Discussed cessation 
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
## 2011-05-19 ENCOUNTER — Ambulatory Visit: Payer: PRIVATE HEALTH INSURANCE | Admitting: Internal Medicine

## 2011-05-23 ENCOUNTER — Encounter: Payer: Self-pay | Admitting: *Deleted

## 2011-06-20 ENCOUNTER — Ambulatory Visit: Payer: PRIVATE HEALTH INSURANCE | Admitting: Emergency Medicine

## 2011-07-08 ENCOUNTER — Telehealth: Payer: Self-pay | Admitting: Emergency Medicine

## 2011-07-08 NOTE — Telephone Encounter (Signed)
She needs to be restarted on her chronic inhaled meds, may very well need steroid taper as well but I am uncomfortable treating her sight-unseen or with pred alone. I would give her samples but she is out of town. At this point the only advice I can give is to be seen again in the ED and explain to MD that she needs help w meds urgently, let them decide if she needs pred +/- abx.

## 2011-07-08 NOTE — Telephone Encounter (Signed)
I spoke with pt and made her aware of RB response. She voiced her understanding and needed nothing further.

## 2011-07-08 NOTE — Telephone Encounter (Signed)
NOTE: I EXPLAINED TO PT THAT BECAUSE OF THE TIME OF DAY THAT PT HAS CALLED THIS MAY NOT BE ADDRESSED. IF SHE FEELS THAT SHE NEEDS IMMEDIATE CARE DUE TO SOB SHE WILL NEED TO GO TO ED OR URGENT CARE IN THE TOWN SHE IS CURRENTLY STAYING. PT ASKS THAT THIS "PLEASE" BE TAKEN CARE OF TODAY. [name]
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
## 2011-07-11 ENCOUNTER — Telehealth: Payer: Self-pay | Admitting: Emergency Medicine

## 2011-07-11 MED ORDER — IPRATROPIUM-ALBUTEROL 18-103 MCG/ACT IN AERO: INHALATION_SPRAY | RESPIRATORY_TRACT | Status: DC

## 2011-07-11 MED ORDER — AZITHROMYCIN 250 MG PO TABS: ORAL_TABLET | ORAL | Status: AC

## 2011-07-11 NOTE — Telephone Encounter (Signed)
Call in:  combivent two puff 4 times daily  #1 Azithromycin 250mg  Take two once then one daily until gone

## 2011-07-11 NOTE — Telephone Encounter (Signed)
Spoke with pt and notified of recs per PW. Pt verbalized understanding. Rxs were sent to pharmacy.
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
## 2011-07-29 ENCOUNTER — Encounter: Payer: Self-pay | Admitting: Emergency Medicine

## 2011-07-29 ENCOUNTER — Ambulatory Visit (INDEPENDENT_AMBULATORY_CARE_PROVIDER_SITE_OTHER): Payer: PRIVATE HEALTH INSURANCE | Admitting: Emergency Medicine

## 2011-07-29 VITALS — BP 112/60 | HR 96 | Temp 98.8°F | Ht 63.0 in | Wt 163.0 lb

## 2011-07-29 DIAGNOSIS — K219 Gastro-esophageal reflux disease without esophagitis: Secondary | ICD-10-CM

## 2011-07-29 DIAGNOSIS — J309 Allergic rhinitis, unspecified: Secondary | ICD-10-CM

## 2011-07-29 DIAGNOSIS — J449 Chronic obstructive pulmonary disease, unspecified: Secondary | ICD-10-CM

## 2011-07-29 MED ORDER — DOXYCYCLINE HYCLATE 100 MG PO TABS: 100.0000 mg | ORAL_TABLET | Freq: Every day | ORAL | Status: AC
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
## 2011-07-29 NOTE — Patient Instructions (Signed)
Please continue your Symbicort and Spiriva Take doxycycline as directed  Continue your medication for heartburn as prescribed. Bring it with you next time so we know what you are taking Stay on loratadine (Claritin) 10mg  daily Follow with Dr Delton Coombes in 3 months or sooner if you have any problems.

## 2011-07-29 NOTE — Progress Notes (Signed)
Addended by: Christen Butter on: 07/29/2011 01:17 PM   Modules accepted: Orders

## 2011-07-29 NOTE — Assessment & Plan Note (Signed)
She is on a med, not known at this time.

## 2011-07-29 NOTE — Assessment & Plan Note (Signed)
loratadine 

## 2011-07-29 NOTE — Assessment & Plan Note (Signed)
Wheeze appears stable but sx consistent w a bronchitis.  - doxy x 1 week - discussed smoking cessation - same BDs - rx gerd and PND

## 2011-08-29 ENCOUNTER — Other Ambulatory Visit: Payer: Self-pay | Admitting: Emergency Medicine

## 2011-09-20 ENCOUNTER — Other Ambulatory Visit: Payer: Self-pay | Admitting: Emergency Medicine

## 2011-09-21 ENCOUNTER — Other Ambulatory Visit: Payer: Self-pay | Admitting: Emergency Medicine

## 2011-09-22 ENCOUNTER — Other Ambulatory Visit: Payer: Self-pay | Admitting: Emergency Medicine

## 2011-09-22 MED ORDER — IPRATROPIUM-ALBUTEROL 18-103 MCG/ACT IN AERO: INHALATION_SPRAY | RESPIRATORY_TRACT | Status: DC

## 2011-10-07 ENCOUNTER — Other Ambulatory Visit: Payer: Self-pay | Admitting: Pulmonary Disease

## 2011-10-07 MED ORDER — IPRATROPIUM-ALBUTEROL 20-100 MCG/ACT IN AERS: 1.0000 | INHALATION_SPRAY | Freq: Four times a day (QID) | RESPIRATORY_TRACT | Status: DC

## 2011-10-07 NOTE — Telephone Encounter (Signed)
yes

## 2011-10-07 NOTE — Addendum Note (Signed)
Addended by: Fenton Foy on: 10/07/2011 05:02 PM   Modules accepted: Orders

## 2011-10-07 NOTE — Telephone Encounter (Signed)
Request received from [facility] in [place] to change Combivent  Inhaler to the Combivent Respimat. please advise if ok to change. Thanks

## 2011-11-08 ENCOUNTER — Telehealth: Payer: Self-pay | Admitting: Emergency Medicine

## 2011-11-08 NOTE — Telephone Encounter (Signed)
I spoke with pt and she states she is not able to get a Refill on her medications for another 7 days due to having medicaid so she wanted a refill on her combivent. I advised her the combivent respimat was sent in 10/07/11 #[facility] She states she did not know that and will call the pharmacy. Nothing further was needed

## 2011-11-21 ENCOUNTER — Encounter: Payer: Self-pay | Admitting: Emergency Medicine

## 2011-11-21 ENCOUNTER — Ambulatory Visit (INDEPENDENT_AMBULATORY_CARE_PROVIDER_SITE_OTHER): Payer: PRIVATE HEALTH INSURANCE | Admitting: Emergency Medicine

## 2011-11-21 VITALS — BP 124/64 | HR 111 | Temp 98.1°F | Ht 63.0 in | Wt 169.6 lb

## 2011-11-21 DIAGNOSIS — J449 Chronic obstructive pulmonary disease, unspecified: Secondary | ICD-10-CM

## 2011-11-21 MED ORDER — DOXYCYCLINE HYCLATE 100 MG PO TABS: 100.0000 mg | ORAL_TABLET | Freq: Two times a day (BID) | ORAL | Status: AC

## 2011-11-21 MED ORDER — PREDNISONE 10 MG PO TABS: ORAL_TABLET | ORAL | Status: DC

## 2011-11-21 NOTE — Patient Instructions (Addendum)
Take prednisone and doxycycline as directed Continue your Spiriva and Symbicort Use albuterol as needed We will perform a walking oximetry at your next visit We will discuss whether you need a CT scan of the chest or any other workup for blood in your sputum at your next visit Follow with Dr Delton Coombes in 1 month or sooner if you have any problems.

## 2011-11-21 NOTE — Progress Notes (Signed)
Subjective:    Patient ID: [name], female    DOB: December 23, 1970, 41 y.o.   MRN: [id number] HPI 41 yo smoker who was admitted to [facility] 9/25 - 10/12 for bilateral MSSA cavitary PNA's c/b L empyema, VDRF. She underwent L CT placement and then a second anterior pigtail to drain residual hydropneumothorax. She went home on IV Ancef and finished 6 weeks of therapy. Pigtail was d/c'd on 02/05/08. Followed now for COPD, tobacco use.   ROV 02/22/11 -- COPD, continued tobacco, hx MSSA cavitary PNA '09. Returns to report that she was recently seen and evaluated in ED for exacerbation +/- PNA, treated with abx + prednisone, now completed. CXR was done - told they suspected PNA, done 10/7 or 8. Unfortunately she tells me that in the last month her son was killed in a MVA. She continues to have cough, wheeze, poor sleep. Smoking about 1 pk/day. She is on Spiriva + Symbicort. She is using ventolin about 2 -3 x a day.   ROV 05/16/11 -- COPD, continued tobacco, hx MSSA cavitary PNA '09. She has been having some episodes of wheezing in the middle of the night, gets waked up and takes ventolin w relief. For the last 2 - 3 weeks has had more exertional SOB. Has been hearing wheezing. Smoking 5 cig a day.  Uses SABA 6 -7 x a day.  On Spiriva + symbicort. She also mentions some painless rectal bleeding for the last 3-4 weeks. She follows w Dr Henreitta Leber in [place], [place].   ROV 07/29/11 -- COPD, continued tobacco, hx MSSA cavitary PNA '09. Returns feeling worse - having more wheeze, difficulty sleeping, having nocturnal cough, chest tightness, decreased energy. Using SABA frequently during the day.  Having severe GERD after meals, not sure about GERD at night. Started a med (can't remember what) in early March, ? PPI. Having sneezing, even on claritin x 1 week.   ROV 11/21/11 -- COPD, continued tobacco, hx MSSA cavitary PNA '09. Chronic cough with severe GERD and nasal allergies. She is feeling worse, has been using Spiriva +  Symbicort + combivent + prn albuterol. She is coughing up dark phlegm - unclear whether these are clots, has had some hemoptysis, has been over about 4 weeks. Her breathing has been worse. She is having some LUE numbness. She is hurting in her chest both at rest and with exertion. No pred or abx since last time. She is on a PPI, but she can't remember the name.   Review of Systems As above    Objective:  Physical Exam Filed Vitals:   11/21/11 1545  BP: 124/64  Pulse: 111  Temp: 98.1 F (36.7 C)   Gen: Pleasant, well-nourished, in no distress,  Depressed affect  ENT: No lesions,  mouth clear,  oropharynx clear, no postnasal drip  Neck: No JVD, no TMG, no carotid bruits  Lungs: distant, coarse B, B exp wheeze louder than baseline  Cardiovascular: RRR, heart sounds normal, no murmur or gallops, no peripheral edema  Musculoskeletal: No deformities, no cyanosis or clubbing  Neuro: alert, non focal  Skin: Warm, no lesions or rashes      Assessment & Plan:  COPD Wheeze on exam with sx that could reflect COPD AE. The blood-tinged sputum is worrisome. - will rx with pred + doxy for AE - rov in 1 mon. If all sx resolved then will defer further w/u, but suspect she will need CT scan chest, FOB, walking oximetry at that time.

## 2011-11-21 NOTE — Assessment & Plan Note (Signed)
Wheeze on exam with sx that could reflect COPD AE. The blood-tinged sputum is worrisome. - will rx with pred + doxy for AE - rov in 1 mon. If all sx resolved then will defer further w/u, but suspect she will need CT scan chest, FOB, walking oximetry at that time.

## 2011-12-08 ENCOUNTER — Other Ambulatory Visit: Payer: Self-pay | Admitting: Emergency Medicine

## 2011-12-09 ENCOUNTER — Telehealth: Payer: Self-pay | Admitting: Emergency Medicine

## 2011-12-09 MED ORDER — ALBUTEROL SULFATE HFA 108 (90 BASE) MCG/ACT IN AERS: 2.0000 | INHALATION_SPRAY | RESPIRATORY_TRACT | Status: DC | PRN

## 2011-12-09 NOTE — Telephone Encounter (Signed)
Refill for the rescue inhaler has been sent to the pharmacy with refills.  lmomtcb for the pt to make her aware.

## 2011-12-12 NOTE — Telephone Encounter (Signed)
Pt aware. Aryanna Shaver, CMA  

## 2011-12-22 ENCOUNTER — Ambulatory Visit (INDEPENDENT_AMBULATORY_CARE_PROVIDER_SITE_OTHER): Payer: PRIVATE HEALTH INSURANCE | Admitting: Emergency Medicine

## 2011-12-22 ENCOUNTER — Encounter: Payer: Self-pay | Admitting: Emergency Medicine

## 2011-12-22 VITALS — BP 118/82 | HR 87 | Temp 98.9°F | Ht 63.0 in | Wt 172.0 lb

## 2011-12-22 DIAGNOSIS — J449 Chronic obstructive pulmonary disease, unspecified: Secondary | ICD-10-CM

## 2011-12-22 NOTE — Progress Notes (Signed)
Subjective:    Patient ID: [name], female    DOB: [birth year], 41 y.o.   MRN: [id number] HPI 41 yo smoker who was admitted to [facility] 9/25 - 10/12 for bilateral MSSA cavitary PNA's c/b L empyema, VDRF. She underwent L CT placement and then a second anterior pigtail to drain residual hydropneumothorax. She went home on IV Ancef and finished 6 weeks of therapy. Pigtail was d/c'd on 02/05/08. Followed now for COPD, tobacco use.   ROV 02/22/11 -- COPD, continued tobacco, hx MSSA cavitary PNA '09. Returns to report that she was recently seen and evaluated in ED for exacerbation +/- PNA, treated with abx + prednisone, now completed. CXR was done - told they suspected PNA, done 10/7 or 8. Unfortunately she tells me that in the last month her son was killed in a MVA. She continues to have cough, wheeze, poor sleep. Smoking about 1 pk/day. She is on Spiriva + Symbicort. She is using ventolin about 2 -3 x a day.   ROV 05/16/11 -- COPD, continued tobacco, hx MSSA cavitary PNA '09. She has been having some episodes of wheezing in the middle of the night, gets waked up and takes ventolin w relief. For the last 2 - 3 weeks has had more exertional SOB. Has been hearing wheezing. Smoking 5 cig a day.  Uses SABA 6 -7 x a day.  On Spiriva + symbicort. She also mentions some painless rectal bleeding for the last 3-4 weeks. She follows w Dr Henreitta Leber in [place], [place].   ROV 07/29/11 -- COPD, continued tobacco, hx MSSA cavitary PNA '09. Returns feeling worse - having more wheeze, difficulty sleeping, having nocturnal cough, chest tightness, decreased energy. Using SABA frequently during the day.  Having severe GERD after meals, not sure about GERD at night. Started a med (can't remember what) in early March, ? PPI. Having sneezing, even on claritin x 1 week.   ROV 11/21/11 -- COPD, continued tobacco, hx MSSA cavitary PNA '09. Chronic cough with severe GERD and nasal allergies. She is feeling worse, has been using Spiriva +  Symbicort + combivent + prn albuterol. She is coughing up dark phlegm - unclear whether these are clots, has had some hemoptysis, has been over about 4 weeks. Her breathing has been worse. She is having some LUE numbness. She is hurting in her chest both at rest and with exertion. No pred or abx since last time. She is on a PPI, but she can't remember the name.   ROV 12/22/11 -- COPD, continued tobacco, hx MSSA cavitary PNA '09. Chronic cough with severe GERD and nasal allergies. Treated with doxy + pred 8/5 >> no real change. Still coughing up plugs of dark, solid mucous. She feels fatigued, doesn't want to do anything. She has noticed chills for a month. No more hemoptysis since the abx. She is only smoking 1 cig a day, every morning when she gets up.   Review of Systems As above    Objective:  Physical Exam Filed Vitals:   12/22/11 1145  BP: 118/82  Pulse: 87  Temp: 98.9 F (37.2 C)   Gen: Pleasant, well-nourished, in no distress,  Depressed affect  ENT: No lesions,  mouth clear,  oropharynx clear, no postnasal drip  Neck: No JVD, no TMG, no carotid bruits  Lungs: distant, coarse B, B exp wheeze louder than baseline  Cardiovascular: RRR, heart sounds normal, no murmur or gallops, no peripheral edema  Musculoskeletal: No deformities, no cyanosis or clubbing  Neuro:  alert, non focal  Skin: Warm, no lesions or rashes      Assessment & Plan:  COPD Will order CT chest. She will likely need FOB also, but will defer until I see the Ct scan since she is no longer having hemoptysis. ROV after the CT. Continue the same BD's

## 2011-12-22 NOTE — Patient Instructions (Addendum)
Please continue your inhaled medications We will perform a CT scan of the chest  Follow with Dr Delton Coombes next available after your CT scan is done

## 2011-12-22 NOTE — Assessment & Plan Note (Signed)
Will order CT chest. She will likely need FOB also, but will defer until I see the Ct scan since she is no longer having hemoptysis. ROV after the CT. Continue the same BD's

## 2011-12-23 ENCOUNTER — Telehealth: Payer: Self-pay | Admitting: Emergency Medicine

## 2011-12-23 NOTE — Telephone Encounter (Signed)
Returned patient's phone call in regards to the CT Chest Scan and the F/U appointment with Dr. Delton Coombes on 12/29/11. Advised patient that we ([facility]) were unable to obtain authorization with her insurance which is [employer] (HMO [place] Medicaid) for the CT Chest and the f/u appointment with Dr. Delton Coombes.  Pt's PCP is Dr. Kennon Holter [phone]) and patient had an appointment with him today (12/23/11). Dr. Remus Loffler has referred patient to a [place] [facility] physician, Dr. Orson Aloe in [place], [place]. Pt has an appointment with him on 02/20/12.  Explained to patient that we [facility]) are not [place] Medicaid physician's and that her insurance requires her to see physician's in the state of [place] for her insurance to cover any service. Offered patient the option to self pay for her CT Scan, patient declined and stated to cancel the CT Chest Scan and the follow up appointment with Dr. Delton Coombes. Pt stated that she would keep the appointment with Dr. Orson Aloe in [place], [place] and to continue to follow with Dr. Remus Loffler until other options are available.

## 2011-12-23 NOTE — Telephone Encounter (Signed)
Thank you :)

## 2011-12-28 ENCOUNTER — Other Ambulatory Visit: Payer: Self-pay | Admitting: Emergency Medicine

## 2011-12-28 NOTE — Telephone Encounter (Signed)
Dr. Cathren Harsh the phone note from 12/23/2011--is this patient still being seen by you? Do you want to fill this Rx? Please advise, thank you

## 2011-12-29 ENCOUNTER — Ambulatory Visit: Payer: PRIVATE HEALTH INSURANCE | Admitting: Emergency Medicine

## 2011-12-29 ENCOUNTER — Other Ambulatory Visit: Payer: PRIVATE HEALTH INSURANCE

## 2012-04-18 HISTORY — PX: BREAST BIOPSY: SHX20

## 2012-06-09 ENCOUNTER — Other Ambulatory Visit: Payer: Self-pay | Admitting: Emergency Medicine

## 2019-07-22 ENCOUNTER — Encounter: Payer: Self-pay | Admitting: Internal Medicine

## 2019-08-01 ENCOUNTER — Encounter: Payer: Self-pay | Admitting: Gastroenterology

## 2019-08-09 ENCOUNTER — Ambulatory Visit (INDEPENDENT_AMBULATORY_CARE_PROVIDER_SITE_OTHER): Payer: Medicaid Other | Admitting: Gastroenterology

## 2019-08-09 ENCOUNTER — Encounter: Payer: Self-pay | Admitting: Gastroenterology

## 2019-08-09 ENCOUNTER — Other Ambulatory Visit: Payer: Self-pay

## 2019-08-09 DIAGNOSIS — R140 Abdominal distension (gaseous): Secondary | ICD-10-CM | POA: Insufficient documentation

## 2019-08-09 DIAGNOSIS — K5900 Constipation, unspecified: Secondary | ICD-10-CM | POA: Diagnosis not present

## 2019-08-09 DIAGNOSIS — R1013 Epigastric pain: Secondary | ICD-10-CM | POA: Diagnosis not present

## 2019-08-09 DIAGNOSIS — Z86010 Personal history of colonic polyps: Secondary | ICD-10-CM

## 2019-08-09 DIAGNOSIS — Z800 Family history of malignant neoplasm of digestive organs: Secondary | ICD-10-CM

## 2019-08-09 MED ORDER — LINACLOTIDE 145 MCG PO CAPS: 145.0000 ug | ORAL_CAPSULE | Freq: Every day | ORAL | 5 refills | Status: DC

## 2019-08-09 NOTE — Progress Notes (Signed)
Primary Care Physician:  Zhou-Talbert, Elwyn Lade, MD  Primary Gastroenterologist:  Garfield Cornea, MD   Chief Complaint  Patient presents with  . Colonoscopy    last tcs 5-6 yrs ago in [place], dad passed away from colon cancer  . Bloated    after eating  . Abdominal Pain    occ, ruq-gallstones  . Nausea    occ, no vomiting; occurs with bloating    HPI:  [name] is a 49 y.o. female with significant history of insomnia, depression, COPD on 4 L of oxygen at bedtime, here at the request of Dr. Angelia Mould for consideration of colonoscopy and upper endoscopy.  Patient has been having issues with dyspepsia.  She worries about having cancer.  Brother-in-law died with liver cancer earlier this year at the age of 26.  She is worried about postprandial abdominal bloating and swelling that she has had for 6 months.  Has gained 30 pounds in 4 to 5 months, she contributes this to overeating.  Develops epigastric pain followed by bloating/swelling.  Sometimes the pain is severe, has to rock back and forth until it goes away, can last for hours.  Has woken up in the middle the night due to the pain before.  Pain can happen without food.  She also has more dull pain in the right upper quadrant off and on throughout the day, nothing severe, not related to food.  Denies heartburn.  No dysphagia.  For the past few weeks she is tried to improve her diet.  She is back on meat, specifically red meat, eliminated salsa, jalapenos.  Eating more fruits.  She feels like she has had less bloating since then.  She has a bowel movement about 2-3 times per week, stools are not hard.  Chronically has had constipation going up to a week at a time without a bowel movement.  Denies melena.  Sometimes has bright red blood per rectum with straining.  Currently not taking anything regularly for her bowel movements.  She reports having a colonoscopy about 5 years ago in [place].  She states she had polyps.  She does  not recall what kind or when she was supposed to have another colonoscopy.  There is also some ambiguous history regarding father's cancer.  She states her multiple siblings and that she has been told that her dad had colon cancer and throat cancer, he died in his 22s.  Recently has had labs.  H. pylori testing.  She states all of these came out okay except for low iron.  She states her iron often is low.  We will have heavy uterine bleeding every 4 to 5 months (perimenopausal).  We have requested records.  She also reports right upper quadrant ultrasound unremarkable except for gallstones.  We requested records.  She reports stool being heme-negative.  She voiced frustration that she really wanted to CT when she went to see her PCP, not surgical procedures.  She believes she is high risk for sedation due to her COPD however states that she was approved for cholecystectomy by her pulmonologist a couple months ago.       Current Outpatient Medications  Medication Sig Dispense Refill  . albuterol (PROVENTIL) (2.5 MG/3ML) 0.083% nebulizer solution Take 2.5 mg by nebulization every 6 (six) hours as needed.      . Fluticasone-Salmeterol (ADVAIR DISKUS) 250-50 MCG/DOSE AEPB Inhale 1 puff into the lungs 2 (two) times daily.    Marland Kitchen omeprazole (PRILOSEC) 40 MG capsule Take  40 mg by mouth daily.    . OXYGEN Inhale into the lungs. 4 liters at bedtime    . SPIRIVA HANDIHALER 18 MCG inhalation capsule INHALE CONTENTS OF ONE CAPSULE BY MOUTH ONCE DAILY 30 capsule 3  . theophylline (THEO-24) 400 MG 24 hr capsule Take 400 mg by mouth daily.    . VENTOLIN HFA 108 (90 BASE) MCG/ACT inhaler INHALE 2 PUFFS INTO LUNGS EVERY 4 HOURS AS NEEDED FOR WHEEZING 18 each 3  . zolpidem (AMBIEN) 5 MG tablet Take 5 mg by mouth at bedtime as needed for sleep.    .        No current facility-administered medications for this visit.    Allergies as of 08/09/2019 - Review Complete 08/09/2019  Allergen Reaction Noted  .  Cefaclor    . Penicillins      Past Medical History:  Diagnosis Date  . Cholelithiasis   . COPD (chronic obstructive pulmonary disease) (HCC)   . Depression   . DVT (deep venous thrombosis) (HCC)    once occurred after trauma,completed coumadin 05/2009  . Empyema   . Hypertension   . Insomnia   . Lung abscess (HCC) 2009  . MSSA (methicillin susceptible Staphylococcus aureus) pneumonia (HCC)   . Pulmonary nodules     Past Surgical History:  Procedure Laterality Date  . BREAST BIOPSY  2014   left breast lumpectomy for "cancer cells"  . CESAREAN SECTION  2005   x2  . Chest tube placement  2009    Family History  Problem Relation Age of Onset  . Heart disease Mother   . Diabetes Mother   . Colon cancer Father   . Throat cancer Father   . Breast cancer Sister     Social History   Socioeconomic History  . Marital status: Single    Spouse name: Not on file  . Number of children: 6  . Years of education: Not on file  . Highest education level: Not on file  Occupational History  . Occupation: homemaker  Tobacco Use  . Smoking status: Current Every Day Smoker    Years: 26.00    Types: Cigarettes  . Smokeless tobacco: Never Used  . Tobacco comment: 1 ciggs a day. Started at age 44.  Substance and Sexual Activity  . Alcohol use: Yes    Comment: occ 1-2 times/month  . Drug use: No  . Sexual activity: Not on file  Other Topics Concern  . Not on file  Social History Narrative  . Not on file   Social Determinants of Health   Financial Resource Strain:   . Difficulty of Paying Living Expenses:   Food Insecurity:   . Worried About Programme researcher, broadcasting/film/video in the Last Year:   . Barista in the Last Year:   Transportation Needs:   . Freight forwarder (Medical):   Marland Kitchen Lack of Transportation (Non-Medical):   Physical Activity:   . Days of Exercise per Week:   . Minutes of Exercise per Session:   Stress:   . Feeling of Stress :   Social Connections:   .  Frequency of Communication with Friends and Family:   . Frequency of Social Gatherings with Friends and Family:   . Attends Religious Services:   . Active Member of Clubs or Organizations:   . Attends Banker Meetings:   Marland Kitchen Marital Status:   Intimate Partner Violence:   . Fear of Current or Ex-Partner:   .  Emotionally Abused:   Marland Kitchen Physically Abused:   . Sexually Abused:       ROS:  General: Negative for anorexia, weight loss, fever, chills, fatigue, weakness. Eyes: Negative for vision changes.  ENT: Negative for hoarseness, difficulty swallowing , nasal congestion. CV: Negative for chest pain, angina, palpitations, dyspnea on exertion, peripheral edema.  Respiratory: Negative for dyspnea at rest,  cough, sputum, +DOE, wheezing.  GI: See history of present illness. GU:  Negative for dysuria, hematuria, urinary incontinence, urinary frequency, nocturnal urination.  MS: Negative for joint pain, low back pain.  Derm: Negative for rash or itching.  Neuro: Negative for weakness, abnormal sensation, seizure, frequent headaches, memory loss, confusion.  Psych: Negative for anxiety,   suicidal ideation, hallucinations. +Depression. Insomnia Endo: Negative for unusual weight change.  Heme: Negative for bruising or bleeding. Allergy: Negative for rash or hives.    Physical Examination:  BP (!) 147/93   Pulse 98   Temp (!) 97.5 F (36.4 C) (Temporal)   Ht 5\' 5"  (1.651 m)   Wt 186 lb 12.8 oz (84.7 kg)   LMP 06/11/2019 (Approximate)   BMI 31.09 kg/m    General: Well-nourished, well-developed in no acute distress.  Head: Normocephalic, atraumatic.   Eyes: Conjunctiva pink, no icterus. Mouth: Oropharyngeal mucosa moist and pink , no lesions erythema or exudate. Neck: Supple without thyromegaly, masses, or lymphadenopathy.  Lungs: Clear to auscultation bilaterally.  Heart: Regular rate and rhythm, no murmurs rubs or gallops.  Abdomen: Bowel sounds are normal, nontender,  nondistended, no hepatosplenomegaly or masses, no abdominal bruits or    hernia , no rebound or guarding.   Rectal: not peformed Extremities: No lower extremity edema. No clubbing or deformities.  Neuro: Alert and oriented x 4 , grossly normal neurologically.  Skin: Warm and dry, no rash or jaundice.   Psych: Alert and cooperative, normal mood and affect.  Labs: Requested.  Imaging Studies: No results found.

## 2019-08-09 NOTE — Assessment & Plan Note (Signed)
Pleasant 49 year old female presenting for further evaluation of epigastric pain associated with abdominal bloating/swelling, constipation.  She has a personal history of colon polyps, pathology unavailable.  Father with reported colon cancer at a young age.  Known cholelithiasis.  COPD, oxygen dependent at bedtime.  She reports recently been approved for possible cholecystectomy by her pulmonologist.  Discussed with patient at length, would consider colonoscopy and possible upper endoscopy to further evaluate GI symptoms, family history of colon cancer.  She is agreeable although voiced that she would like CT Abd/pelvis to rule out malignancy. We will obtain records of recent labs and u/s. further recommendations will be made.

## 2019-08-09 NOTE — Patient Instructions (Signed)
1. Start Linzess 145 mcg daily on empty stomach for constipation. RX sent to pharmacy. 2. I will review your labs and ultrasound once received and we will decide next step in your work up.

## 2019-08-09 NOTE — Assessment & Plan Note (Signed)
Start Linzess daily on empty stomach.

## 2019-08-19 ENCOUNTER — Telehealth: Payer: Self-pay | Admitting: Internal Medicine

## 2019-08-19 NOTE — Telephone Encounter (Signed)
Routing to LSL 

## 2019-08-19 NOTE — Telephone Encounter (Signed)
patientt called and said lsl was supposed to go over her records to plan her care and that she had not heard back from the office.  It has been 2 weeks

## 2019-09-02 ENCOUNTER — Telehealth: Payer: Self-pay | Admitting: Gastroenterology

## 2019-09-02 NOTE — Telephone Encounter (Signed)
LSL, I spoke with pt. Pt is aware that her results were reviewed. Pt is aware that her  Pt's PCP ordered a CT scan in [place] this week for pt. Pt felt the Linzess was working at first and will revisit it after her CT scan.

## 2019-09-02 NOTE — Telephone Encounter (Signed)
Records from outside facility:  [place] abd limited dated 07/12/19 by PCP, done at [facility]: Cholelithiasis but no findings of acute cholecystitis. Normal CBD diameter of 31mm. Liver unremarkable.   Labs from PCP: 3/21: A1C of 5.8, BUN 7, Cre 0.62, alb 4.1, Tbili 0.4, AP 100, AST 11, ALT 10, WBC 9600, H/H 15.3/45.3, plt 338000, lipase 25, hcv ab neg, TSH 0.93, iron 71, TIBC 412, fe sat 17  3/21: H.pylori stool Ag negative.  PLEASE LET PT KNOW REVIEWED LABS/STOOL/U/S.  1. OFFER CT A/P WITH CONTRAST FOR EPIG PAIN/CONSTIPATION/CHANGE IN BOWELS/FH CRC. 2. IF CT NEG, THEN TCS +/- EGD WITH PROPOFOL WOULD BE NEXT STEP. 3. HOW IS CONSTIPATION ON LINZESS?

## 2019-09-02 NOTE — Telephone Encounter (Signed)
Noted  

## 2019-09-26 ENCOUNTER — Telehealth: Payer: Self-pay | Admitting: Internal Medicine

## 2019-09-26 NOTE — Telephone Encounter (Signed)
Patient called and said that she has not spoken to anyone about her ct results from a month ago and would like to know what her plan of care is [phone]

## 2019-09-26 NOTE — Telephone Encounter (Signed)
Lmom, waiting on a return call.  

## 2019-09-27 NOTE — Telephone Encounter (Signed)
Spoke with pt. Pt wanted to check to see if our office received the CT results from her PCP? Pt had CT done around mid to late May 2021. LSL has reviewed previous notes from pts PCP but the recent CT scan was completed after L[facility] received those notes.

## 2019-11-06 ENCOUNTER — Ambulatory Visit: Payer: PRIVATE HEALTH INSURANCE | Admitting: Gastroenterology

## 2020-06-22 ENCOUNTER — Telehealth: Payer: Self-pay

## 2020-06-22 ENCOUNTER — Other Ambulatory Visit: Payer: Self-pay

## 2020-06-22 ENCOUNTER — Ambulatory Visit: Payer: Medicaid Other | Admitting: Nurse Practitioner

## 2020-06-22 ENCOUNTER — Encounter: Payer: Self-pay | Admitting: Nurse Practitioner

## 2020-06-22 ENCOUNTER — Other Ambulatory Visit: Payer: Self-pay | Admitting: Nurse Practitioner

## 2020-06-22 VITALS — BP 128/80 | HR 102 | Temp 98.3°F | Resp 20 | Ht 65.0 in | Wt 152.0 lb

## 2020-06-22 DIAGNOSIS — J449 Chronic obstructive pulmonary disease, unspecified: Secondary | ICD-10-CM

## 2020-06-22 DIAGNOSIS — R000 Tachycardia, unspecified: Secondary | ICD-10-CM | POA: Diagnosis not present

## 2020-06-22 DIAGNOSIS — Z7689 Persons encountering health services in other specified circumstances: Secondary | ICD-10-CM

## 2020-06-22 DIAGNOSIS — R140 Abdominal distension (gaseous): Secondary | ICD-10-CM

## 2020-06-22 DIAGNOSIS — K5900 Constipation, unspecified: Secondary | ICD-10-CM | POA: Diagnosis not present

## 2020-06-22 DIAGNOSIS — E559 Vitamin D deficiency, unspecified: Secondary | ICD-10-CM

## 2020-06-22 DIAGNOSIS — Z139 Encounter for screening, unspecified: Secondary | ICD-10-CM

## 2020-06-22 DIAGNOSIS — D649 Anemia, unspecified: Secondary | ICD-10-CM

## 2020-06-22 DIAGNOSIS — G4700 Insomnia, unspecified: Secondary | ICD-10-CM

## 2020-06-22 MED ORDER — ZOLPIDEM TARTRATE 5 MG PO TABS: 5.0000 mg | ORAL_TABLET | Freq: Every evening | ORAL | 2 refills | Status: DC | PRN

## 2020-06-22 MED ORDER — ZOLPIDEM TARTRATE 5 MG PO TABS
5.0000 mg | ORAL_TABLET | Freq: Every evening | ORAL | 2 refills | Status: DC | PRN
Start: 2020-06-22 — End: 2020-06-22

## 2020-06-22 MED ORDER — METOPROLOL TARTRATE 25 MG PO TABS: 37.5000 mg | ORAL_TABLET | Freq: Every day | ORAL | 3 refills | Status: DC

## 2020-06-22 NOTE — Telephone Encounter (Signed)
sent 

## 2020-06-22 NOTE — Assessment & Plan Note (Signed)
-  takes metoprolol, but she has been out of this -she needs refill, but doesn't know her dosage -she will call with her dosing information

## 2020-06-22 NOTE — Assessment & Plan Note (Signed)
-  will check with next set of labs 

## 2020-06-22 NOTE — Telephone Encounter (Signed)
Pt called and states her Metoprolol rx dosage was 25mg  and she currently takes 1.5 tab daily for a total of 37.5mg  and needs refill sent to [facility].

## 2020-06-22 NOTE — Addendum Note (Signed)
Addended by: Dellia Cloud on: 06/22/2020 10:31 AM   Modules accepted: Orders

## 2020-06-22 NOTE — Assessment & Plan Note (Signed)
-  1 ppd smoker for almost 30 years -sees [facility] pulmonology -takes advair, spiriva, and PRN albuterol -doing well, doesn't need albuterol often; usually just on sick days -still smoking; not interested in quitting

## 2020-06-22 NOTE — Assessment & Plan Note (Signed)
-  will get records from past PCP and pulmonology

## 2020-06-22 NOTE — Assessment & Plan Note (Signed)
-  states she has tried trazodone and seroquel and maybe a few other medications in the past -she has been out of her Remus Loffler for the last 2 months -Refilled today

## 2020-06-22 NOTE — Progress Notes (Signed)
New Patient Office Visit  Subjective:  Patient ID: [name], female    DOB: 1970/06/06  Age: 50 y.o. MRN: [id number]  CC:  Chief Complaint  Patient presents with  . New Patient (Initial Visit)    HPI [name] presents for new patient visit. Transferring care from [facility] in [place]. Last physical was over a year ago. Last labs were drawn in Oct 2021.  She is followed by Dr. Gala Romney. Has epigastric pain and distention after eating, and that is causing loss of appetite.  Past Medical History:  Diagnosis Date  . ASTHMA 02/11/2008   Qualifier: Diagnosis of  By: Wynetta Emery RN, Doroteo Bradford    . Cholelithiasis   . COPD (chronic obstructive pulmonary disease) ([place])   . DVT 12/18/2008   Qualifier: Diagnosis of  By: Lamonte Sakai MD, Rose Fillers   . DVT (deep venous thrombosis) ([place])    once occurred after trauma,completed coumadin 05/2009  . Empyema   . GERD (gastroesophageal reflux disease)   . Hypertension   . Insomnia   . Lung abscess ([place]) 2009  . METHICILLIN SUSECPTIBLE PNEUMONIA STAPH AUREUS 02/11/2008   Qualifier: Diagnosis of  By: Wynetta Emery RN, Doroteo Bradford    . MSSA (methicillin susceptible Staphylococcus aureus) pneumonia ([place])   . Pulmonary nodules   . Rectal bleeding 05/16/2011    Past Surgical History:  Procedure Laterality Date  . BREAST BIOPSY  2014   left breast lumpectomy for "cancer cells"  . CESAREAN SECTION  2005   x2  . Chest tube placement  2009    Family History  Problem Relation Age of Onset  . Heart disease Mother   . Diabetes Mother   . Colon cancer Father   . Throat cancer Father   . Breast cancer Sister     Social History   Socioeconomic History  . Marital status: Single    Spouse name: Not on file  . Number of children: 6  . Years of education: Not on file  . Highest education level: Not on file  Occupational History  . Occupation: homemaker  Tobacco Use  . Smoking status: Current Every Day Smoker    Packs/day: 1.00    Years: 26.00     Pack years: 26.00    Types: Cigarettes  . Smokeless tobacco: Never Used  . Tobacco comment: 1 ciggs a day. Started at age 53.  Vaping Use  . Vaping Use: Never used  Substance and Sexual Activity  . Alcohol use: Yes    Comment: mixed drink 1-2 times/month  . Drug use: No  . Sexual activity: Not Currently    Birth control/protection: None  Other Topics Concern  . Not on file  Social History Narrative  . Not on file   Social Determinants of Health   Financial Resource Strain: Not on file  Food Insecurity: Not on file  Transportation Needs: Not on file  Physical Activity: Not on file  Stress: Not on file  Social Connections: Not on file  Intimate Partner Violence: Not on file    ROS Review of Systems  Constitutional: Positive for unexpected weight change.       Weight loss d/t decreased appetite  Respiratory: Negative.   Cardiovascular: Negative.   Gastrointestinal: Positive for abdominal distention and abdominal pain. Negative for nausea and vomiting.  Musculoskeletal: Negative.   Neurological: Negative.   Psychiatric/Behavioral: Positive for sleep disturbance. Negative for dysphoric mood, self-injury and suicidal ideas. The patient is not nervous/anxious.     Objective:  Today's Vitals: BP 128/80   Pulse (!) 102   Temp 98.3 F (36.8 C)   Resp 20   Ht _0  (1.651 m)   Wt 152 lb (68.9 kg)   SpO2 (!) 86%   BMI 25.29 kg/m   Physical Exam Constitutional:      Appearance: Normal appearance.  Cardiovascular:     Rate and Rhythm: Regular rhythm. Tachycardia present.     Pulses: Normal pulses.     Heart sounds: Normal heart sounds.  Pulmonary:     Effort: Pulmonary effort is normal.     Breath sounds: Normal breath sounds.  Abdominal:     General: Abdomen is flat. There is no distension.     Palpations: Abdomen is soft. There is no mass.     Tenderness: There is no abdominal tenderness.  Neurological:     Mental Status: She is alert.  Psychiatric:         Mood and Affect: Mood normal.        Behavior: Behavior normal.        Thought Content: Thought content normal.        Judgment: Judgment normal.     Assessment & Plan:   Problem List Items Addressed This Visit      Respiratory   COPD (chronic obstructive pulmonary disease) ([place])    -1 ppd smoker for almost 30 years -sees [facility] pulmonology -takes advair, spiriva, and PRN albuterol -doing well, doesn't need albuterol often; usually just on sick days -still smoking; not interested in quitting        Other   ANEMIA    -will check iron panel with next set of labs      Relevant Orders   Iron, TIBC and Ferritin Panel   Constipation - Primary   Relevant Orders   Ambulatory referral to Gastroenterology   Abdominal bloating    -has pain after eating and then gets bloating and distention -saw GI in the past, but she states they didn't f/u with her and couldn't get all of her records -referral to GI       Tachycardia    -takes metoprolol, but she has been out of this -she needs refill, but doesn't know her dosage -she will call with her dosing information      Relevant Orders   CBC with Differential/Platelet   CMP14+EGFR   Lipid Panel With LDL/HDL Ratio   Insomnia    -states she has tried trazodone and seroquel and maybe a few other medications in the past -she has been out of her Lorrin Mais for the last 2 months -Refilled today      Vitamin D deficiency    -will check with next set of labs      Relevant Orders   VITAMIN D 25 Hydroxy (Vit-D Deficiency, Fractures)   Encounter to establish care    -will get records from past PCP and pulmonology      Relevant Orders   CBC with Differential/Platelet   CMP14+EGFR   Iron, TIBC and Ferritin Panel   Lipid Panel With LDL/HDL Ratio   VITAMIN D 25 Hydroxy (Vit-D Deficiency, Fractures)   Screening due      Outpatient Encounter Medications as of 06/22/2020  Medication Sig  . albuterol (PROVENTIL) (2.5 MG/3ML) 0.083%  nebulizer solution Take 2.5 mg by nebulization every 6 (six) hours as needed.  . Fluticasone-Salmeterol (ADVAIR) 250-50 MCG/DOSE AEPB Inhale 1 puff into the lungs 2 (two) times daily.  Marland Kitchen omeprazole (PRILOSEC) 40 MG  capsule Take 40 mg by mouth daily.  . OXYGEN Inhale into the lungs. 4 liters at bedtime  . SPIRIVA HANDIHALER 18 MCG inhalation capsule INHALE CONTENTS OF ONE CAPSULE BY MOUTH ONCE DAILY  . theophylline (THEO-24) 400 MG 24 hr capsule Take 400 mg by mouth daily.  . VENTOLIN HFA 108 (90 BASE) MCG/ACT inhaler INHALE 2 PUFFS INTO LUNGS EVERY 4 HOURS AS NEEDED FOR WHEEZING  . zolpidem (AMBIEN) 5 MG tablet Take 1 tablet (5 mg total) by mouth at bedtime as needed for sleep.  . [DISCONTINUED] zolpidem (AMBIEN) 5 MG tablet Take 5 mg by mouth at bedtime as needed for sleep.  . [DISCONTINUED] linaclotide (LINZESS) 145 MCG CAPS capsule Take 1 capsule (145 mcg total) by mouth daily before breakfast. (Patient not taking: Reported on 06/22/2020)   No facility-administered encounter medications on file as of 06/22/2020.    Follow-up: Return in about 2 weeks (around 07/06/2020) for Physical Exam.   Noreene Larsson, NP

## 2020-06-22 NOTE — Patient Instructions (Signed)
Dr. Patty Sermons office will call to set up an appointment for your abdominal issues.  Their office in on the floor above [place].

## 2020-06-22 NOTE — Assessment & Plan Note (Signed)
-  will check iron panel with next set of labs 

## 2020-06-22 NOTE — Assessment & Plan Note (Signed)
-  has pain after eating and then gets bloating and distention -saw GI in the past, but she states they didn't f/u with her and couldn't get all of her records -referral to GI

## 2020-06-30 ENCOUNTER — Other Ambulatory Visit: Payer: Self-pay | Admitting: Nurse Practitioner

## 2020-06-30 DIAGNOSIS — J449 Chronic obstructive pulmonary disease, unspecified: Secondary | ICD-10-CM

## 2020-06-30 LAB — CBC WITH DIFFERENTIAL/PLATELET
Basophils Absolute: 0 10*3/uL (ref 0.0–0.2)
Basos: 1 %
EOS (ABSOLUTE): 0.2 10*3/uL (ref 0.0–0.4)
Eos: 3 %
Hematocrit: 51.6 % — ABNORMAL HIGH (ref 34.0–46.6)
Hemoglobin: 16.7 g/dL — ABNORMAL HIGH (ref 11.1–15.9)
Immature Grans (Abs): 0 10*3/uL (ref 0.0–0.1)
Immature Granulocytes: 0 %
Lymphocytes Absolute: 1.7 10*3/uL (ref 0.7–3.1)
Lymphs: 29 %
MCH: 30.3 pg (ref 26.6–33.0)
MCHC: 32.4 g/dL (ref 31.5–35.7)
MCV: 94 fL (ref 79–97)
Monocytes Absolute: 0.6 10*3/uL (ref 0.1–0.9)
Monocytes: 10 %
Neutrophils Absolute: 3.6 10*3/uL (ref 1.4–7.0)
Neutrophils: 57 %
Platelets: 260 10*3/uL (ref 150–450)
RBC: 5.51 x10E6/uL — ABNORMAL HIGH (ref 3.77–5.28)
RDW: 14.5 % (ref 11.7–15.4)
WBC: 6.1 10*3/uL (ref 3.4–10.8)

## 2020-06-30 LAB — CMP14+EGFR
ALT: 9 IU/L (ref 0–32)
AST: 17 IU/L (ref 0–40)
Albumin/Globulin Ratio: 1.6 (ref 1.2–2.2)
Albumin: 4.2 g/dL (ref 3.8–4.9)
Alkaline Phosphatase: 104 IU/L (ref 44–121)
BUN/Creatinine Ratio: 11 (ref 9–23)
BUN: 7 mg/dL (ref 6–24)
Bilirubin Total: 0.3 mg/dL (ref 0.0–1.2)
CO2: 26 mmol/L (ref 20–29)
Calcium: 9.8 mg/dL (ref 8.7–10.2)
Chloride: 102 mmol/L (ref 96–106)
Creatinine, Ser: 0.66 mg/dL (ref 0.57–1.00)
Globulin, Total: 2.7 g/dL (ref 1.5–4.5)
Glucose: 86 mg/dL (ref 65–99)
Potassium: 4.1 mmol/L (ref 3.5–5.2)
Sodium: 142 mmol/L (ref 134–144)
Total Protein: 6.9 g/dL (ref 6.0–8.5)
eGFR: 106 mL/min/{1.73_m2} (ref >59)

## 2020-06-30 LAB — LIPID PANEL WITH LDL/HDL RATIO
Cholesterol, Total: 156 mg/dL (ref 100–199)
HDL: 48 mg/dL (ref >39)
LDL Chol Calc (NIH): 90 mg/dL (ref 0–99)
LDL/HDL Ratio: 1.9 ratio (ref 0.0–3.2)
Triglycerides: 95 mg/dL (ref 0–149)
VLDL Cholesterol Cal: 18 mg/dL (ref 5–40)

## 2020-06-30 LAB — IRON,TIBC AND FERRITIN PANEL
Ferritin: 48 ng/mL (ref 15–150)
Iron Saturation: 22 % (ref 15–55)
Iron: 80 ug/dL (ref 27–159)
Total Iron Binding Capacity: 366 ug/dL (ref 250–450)
UIBC: 286 ug/dL (ref 131–425)

## 2020-06-30 LAB — VITAMIN D 25 HYDROXY (VIT D DEFICIENCY, FRACTURES): Vit D, 25-Hydroxy: 20.2 ng/mL — ABNORMAL LOW (ref 30.0–100.0)

## 2020-06-30 NOTE — Progress Notes (Signed)
I sent a referral to [facility] pulmonology here in [place].

## 2020-06-30 NOTE — Progress Notes (Signed)
Her red blood cell count is a little elevated, but kidney and liver function look great. Vitamin D is a little low, and iron levels are great. Cholesterol looks good. We will discuss labs at our appointment on 3/21.

## 2020-07-06 ENCOUNTER — Encounter: Payer: Self-pay | Admitting: Nurse Practitioner

## 2020-07-06 ENCOUNTER — Ambulatory Visit (INDEPENDENT_AMBULATORY_CARE_PROVIDER_SITE_OTHER): Payer: Medicaid Other | Admitting: Nurse Practitioner

## 2020-07-06 ENCOUNTER — Other Ambulatory Visit: Payer: Self-pay

## 2020-07-06 VITALS — BP 130/80 | HR 100 | Temp 98.8°F | Resp 18 | Ht 64.0 in | Wt 152.0 lb

## 2020-07-06 DIAGNOSIS — J449 Chronic obstructive pulmonary disease, unspecified: Secondary | ICD-10-CM

## 2020-07-06 DIAGNOSIS — Z124 Encounter for screening for malignant neoplasm of cervix: Secondary | ICD-10-CM | POA: Diagnosis not present

## 2020-07-06 DIAGNOSIS — E559 Vitamin D deficiency, unspecified: Secondary | ICD-10-CM

## 2020-07-06 DIAGNOSIS — K130 Diseases of lips: Secondary | ICD-10-CM | POA: Diagnosis not present

## 2020-07-06 DIAGNOSIS — Z1231 Encounter for screening mammogram for malignant neoplasm of breast: Secondary | ICD-10-CM | POA: Diagnosis not present

## 2020-07-06 DIAGNOSIS — Z1211 Encounter for screening for malignant neoplasm of colon: Secondary | ICD-10-CM | POA: Diagnosis not present

## 2020-07-06 MED ORDER — VITAMIN D (ERGOCALCIFEROL) 1.25 MG (50000 UNIT) PO CAPS: 50000.0000 [IU] | ORAL_CAPSULE | ORAL | 0 refills | Status: DC

## 2020-07-06 MED ORDER — CLOTRIMAZOLE 1 % EX CREA: 1.0000 "application " | TOPICAL_CREAM | Freq: Two times a day (BID) | CUTANEOUS | 0 refills | Status: DC

## 2020-07-06 NOTE — Assessment & Plan Note (Signed)
-  to bilateral lips/face -discussed use of barrier creams

## 2020-07-06 NOTE — Progress Notes (Signed)
Established Patient Office Visit  Subjective:  Patient ID: Kim SmallingLisa G Mccowen, female    DOB: 1970-06-06  Age: 50 y.o. MRN: [id number]  CC:  Chief Complaint  Patient presents with  . Annual Exam    HPI Kim SmallingLisa G Shirk presents for physical exam. She has COPD and O2 is consistently low, but she refuses oxygen therapy.   Past Medical History:  Diagnosis Date  . ASTHMA 02/11/2008   Qualifier: Diagnosis of  By: Laural BenesJohnson RN, Cicero DuckErika    . Cholelithiasis   . COPD (chronic obstructive pulmonary disease) ([facility])   . DVT 12/18/2008   Qualifier: Diagnosis of  By: Delton CoombesByrum MD, Les Pouobert S   . DVT (deep venous thrombosis) ([facility])    once occurred after trauma,completed coumadin 05/2009  . Empyema   . GERD (gastroesophageal reflux disease)   . Hypertension   . Insomnia   . Lung abscess ([facility]) 2009  . METHICILLIN SUSECPTIBLE PNEUMONIA STAPH AUREUS 02/11/2008   Qualifier: Diagnosis of  By: Laural BenesJohnson RN, Cicero DuckErika    . MSSA (methicillin susceptible Staphylococcus aureus) pneumonia ([facility])   . Pulmonary nodules   . Rectal bleeding 05/16/2011    Past Surgical History:  Procedure Laterality Date  . BREAST BIOPSY  2014   left breast lumpectomy for "cancer cells"  . CESAREAN SECTION  2005   x2  . Chest tube placement  2009    Family History  Problem Relation Age of Onset  . Heart disease Mother   . Diabetes Mother   . Colon cancer Father   . Throat cancer Father   . Breast cancer Sister     Social History   Socioeconomic History  . Marital status: Single    Spouse name: Not on file  . Number of children: 6  . Years of education: Not on file  . Highest education level: Not on file  Occupational History  . Occupation: homemaker  Tobacco Use  . Smoking status: Current Every Day Smoker    Packs/day: 1.00    Years: 26.00    Pack years: 26.00    Types: Cigarettes  . Smokeless tobacco: Never Used  . Tobacco comment: 1 ciggs a day. Started at age 50.  Vaping Use  . Vaping Use: Never used  Substance and  Sexual Activity  . Alcohol use: Yes    Comment: mixed drink 1-2 times/month  . Drug use: No  . Sexual activity: Not Currently    Birth control/protection: None  Other Topics Concern  . Not on file  Social History Narrative  . Not on file   Social Determinants of Health   Financial Resource Strain: Not on file  Food Insecurity: Not on file  Transportation Needs: Not on file  Physical Activity: Not on file  Stress: Not on file  Social Connections: Not on file  Intimate Partner Violence: Not on file    Outpatient Medications Prior to Visit  Medication Sig Dispense Refill  . albuterol (PROVENTIL) (2.5 MG/3ML) 0.083% nebulizer solution Take 2.5 mg by nebulization every 6 (six) hours as needed.    . Fluticasone-Salmeterol (ADVAIR) 250-50 MCG/DOSE AEPB Inhale 1 puff into the lungs 2 (two) times daily.    . metoprolol tartrate (LOPRESSOR) 25 MG tablet Take 1.5 tablets (37.5 mg total) by mouth daily. 180 tablet 3  . omeprazole (PRILOSEC) 40 MG capsule Take 40 mg by mouth daily.    . OXYGEN Inhale into the lungs. 4 liters at bedtime    . SPIRIVA HANDIHALER 18 MCG inhalation capsule  INHALE CONTENTS OF ONE CAPSULE BY MOUTH ONCE DAILY 30 capsule 3  . theophylline (THEO-24) 400 MG 24 hr capsule Take 400 mg by mouth daily.    . VENTOLIN HFA 108 (90 BASE) MCG/ACT inhaler INHALE 2 PUFFS INTO LUNGS EVERY 4 HOURS AS NEEDED FOR WHEEZING 18 each 3  . zolpidem (AMBIEN) 5 MG tablet Take 1 tablet (5 mg total) by mouth at bedtime as needed for sleep. 30 tablet 2   No facility-administered medications prior to visit.    Allergies  Allergen Reactions  . Cefaclor   . Penicillins     ROS Review of Systems  Constitutional: Negative.   HENT: Negative.   Eyes: Negative.   Respiratory: Positive for shortness of breath and wheezing.        She is at her baseline  Cardiovascular: Negative.   Gastrointestinal: Negative.   Endocrine: Negative.   Genitourinary: Negative.   Musculoskeletal: Negative.    Skin: Negative.   Allergic/Immunologic: Negative.   Neurological: Negative.   Hematological: Negative.   Psychiatric/Behavioral: Negative.       Objective:    Physical Exam Constitutional:      Appearance: She is ill-appearing.  HENT:     Head: Normocephalic and atraumatic.     Right Ear: Tympanic membrane, ear canal and external ear normal.     Left Ear: Tympanic membrane, ear canal and external ear normal.     Nose: Nose normal.     Mouth/Throat:     Mouth: Mucous membranes are moist.     Pharynx: Oropharynx is clear.  Eyes:     Extraocular Movements: Extraocular movements intact.     Conjunctiva/sclera: Conjunctivae normal.     Pupils: Pupils are equal, round, and reactive to light.  Cardiovascular:     Rate and Rhythm: Normal rate and regular rhythm.     Pulses: Normal pulses.     Heart sounds: Normal heart sounds.  Pulmonary:     Breath sounds: Wheezing present.  Abdominal:     General: Abdomen is flat. Bowel sounds are normal.     Palpations: Abdomen is soft.  Musculoskeletal:        General: Normal range of motion.     Cervical back: Normal range of motion and neck supple.  Skin:    General: Skin is warm and dry.     Capillary Refill: Capillary refill takes less than 2 seconds.     Comments: Angular cheilitis to lip folds bilaterally  Neurological:     General: No focal deficit present.     Mental Status: She is alert and oriented to person, place, and time.     Cranial Nerves: No cranial nerve deficit.     Sensory: No sensory deficit.     Motor: No weakness.     Coordination: Coordination normal.     Gait: Gait normal.  Psychiatric:        Mood and Affect: Mood normal.        Behavior: Behavior normal.        Thought Content: Thought content normal.        Judgment: Judgment normal.     BP 130/80   Pulse 100   Temp 98.8 F (37.1 C)   Resp 18   Ht 5\' 4"  (1.626 m)   Wt 152 lb (68.9 kg)   SpO2 (!) 84%   BMI 26.09 kg/m  Wt Readings from Last 3  Encounters:  07/06/20 152 lb (68.9 kg)  06/22/20 152 lb (68.9  kg)  08/09/19 186 lb 12.8 oz (84.7 kg)     Health Maintenance Due  Topic Date Due  . COVID-19 Vaccine (1) Never done  . TETANUS/TDAP  Never done  . PAP SMEAR-Modifier  Never done  . COLONOSCOPY (Pts 45-65yrs Insurance coverage will need to be confirmed)  Never done  . MAMMOGRAM  Never done    There are no preventive care reminders to display for this patient.   Lab Results  Component Value Date   WBC 6.1 06/29/2020   HGB 16.7 (H) 06/29/2020   HCT 51.6 (H) 06/29/2020   MCV 94 06/29/2020   PLT 260 06/29/2020   Lab Results  Component Value Date   NA 142 06/29/2020   K 4.1 06/29/2020   CO2 26 06/29/2020   GLUCOSE 86 06/29/2020   BUN 7 06/29/2020   CREATININE 0.66 06/29/2020   BILITOT 0.3 06/29/2020   ALKPHOS 104 06/29/2020   AST 17 06/29/2020   ALT 9 06/29/2020   PROT 6.9 06/29/2020   ALBUMIN 4.2 06/29/2020   CALCIUM 9.8 06/29/2020   Lab Results  Component Value Date   CHOL 156 06/29/2020   Lab Results  Component Value Date   HDL 48 06/29/2020   Lab Results  Component Value Date   LDLCALC 90 06/29/2020   Lab Results  Component Value Date   TRIG 95 06/29/2020   Lab Results  Component Value Date   CHOLHDL 3.6 12/13/2008   Lab Results  Component Value Date   HGBA1C  01/20/2008    5.9 (NOTE)   The ADA recommends the following therapeutic goal for glycemic   control related to Hgb A1C measurement:   Goal of Therapy:   < 7.0% Hgb A1C   Reference: American Diabetes Association: Clinical Practice   Recommendations 2008, Diabetes Care,  2008, 31:(Suppl 1).      Assessment & Plan:   Problem List Items Addressed This Visit      Respiratory   COPD (chronic obstructive pulmonary disease) ([facility])    -O2 is low; we discussed oxygen therapy again, but she refuses today -we discussed COVID vaccine, but she declines -she has referral to pulm pending        Digestive   Angular cheilitis    -to  bilateral lips/face -discussed use of barrier creams      Relevant Medications   clotrimazole (LOTRIMIN) 1 % cream     Other   Vitamin D deficiency    -Rx. Vit D      Relevant Medications   Vitamin D, Ergocalciferol, (DRISDOL) 1.25 MG (50000 UNIT) CAPS capsule    Other Visit Diagnoses    Pap smear for cervical cancer screening    -  Primary   Relevant Orders   Ambulatory referral to Obstetrics / Gynecology   Screening for malignant neoplasm of colon       Relevant Orders   Cologuard   Encounter for screening mammogram for malignant neoplasm of breast       Relevant Orders   MM Digital Screening      Meds ordered this encounter  Medications  . clotrimazole (LOTRIMIN) 1 % cream    Sig: Apply 1 application topically 2 (two) times daily.    Dispense:  30 g    Refill:  0  . Vitamin D, Ergocalciferol, (DRISDOL) 1.25 MG (50000 UNIT) CAPS capsule    Sig: Take 1 capsule (50,000 Units total) by mouth every 7 (seven) days.    Dispense:  8 capsule  Refill:  0    Follow-up: Return in about 6 months (around 01/06/2021) for Lab follow-up.    Heather Roberts, NP

## 2020-07-06 NOTE — Assessment & Plan Note (Signed)
-  Rx. Vit D

## 2020-07-06 NOTE — Patient Instructions (Addendum)
Angular Cheilitis is the name of the condition affecting the corners of your mouth.  The Reference Material states "Treatment -- The management of angular cheilitis involves the control of local predisposing factors and treatment of fungal or bacterial superinfection. General measures to reduce moisture pooling at the corners of the mouth include: ?Improving denture fit and cleaning ?Maintaining optimal oral hygiene ?Treating sicca symptoms (dry mouth) (see "Treatment of dry mouth and other non-ocular sicca symptoms in Sjgren's syndrome", section on 'Treatment of dry mouth') ?Use of barrier creams (eg, zinc oxide paste) or petrolatum  For patients with positive KOH preparation, we suggest topical antifungal therapy with azole (eg, miconazole, clotrimazole) ointment. The topical antifungal is applied two times per day for one to three weeks and repeated as necessary. For patients with associated Candida stomatitis, clotrimazole troches (one 10 mg troche dissolved slowly five times daily for two weeks) or oral fluconazole (100 mg per day for one to two weeks) may be beneficial. (See "Oropharyngeal candidiasis in adults".)  Once cleared, a barrier cream or petrolatum applied nightly can help protect the skin from moisture. However, recurrence of angular cheilitis is common [34]."   I called in a cream for your lips. You can use a barrier cream like zinc oxide or vasoline to prevent this from occurring again.  For COPD, please contact [place] pulmonology at  [phone]. They will schedule you for a visit.

## 2020-07-06 NOTE — Assessment & Plan Note (Signed)
-  O2 is low; we discussed oxygen therapy again, but she refuses today -we discussed COVID vaccine, but she declines -she has referral to pulm pending

## 2020-07-17 ENCOUNTER — Other Ambulatory Visit: Payer: Self-pay

## 2020-07-17 ENCOUNTER — Encounter: Payer: Self-pay | Admitting: Pulmonary Disease

## 2020-07-17 ENCOUNTER — Ambulatory Visit (INDEPENDENT_AMBULATORY_CARE_PROVIDER_SITE_OTHER): Payer: Medicaid Other | Admitting: Pulmonary Disease

## 2020-07-17 VITALS — BP 136/80 | HR 102 | Temp 98.6°F | Ht 65.0 in | Wt 150.2 lb

## 2020-07-17 DIAGNOSIS — F17210 Nicotine dependence, cigarettes, uncomplicated: Secondary | ICD-10-CM

## 2020-07-17 DIAGNOSIS — Z716 Tobacco abuse counseling: Secondary | ICD-10-CM | POA: Diagnosis not present

## 2020-07-17 DIAGNOSIS — J302 Other seasonal allergic rhinitis: Secondary | ICD-10-CM | POA: Diagnosis not present

## 2020-07-17 DIAGNOSIS — J449 Chronic obstructive pulmonary disease, unspecified: Secondary | ICD-10-CM

## 2020-07-17 DIAGNOSIS — F17200 Nicotine dependence, unspecified, uncomplicated: Secondary | ICD-10-CM

## 2020-07-17 DIAGNOSIS — R062 Wheezing: Secondary | ICD-10-CM

## 2020-07-17 MED ORDER — TRELEGY ELLIPTA 100-62.5-25 MCG/INH IN AEPB: 1.0000 | INHALATION_SPRAY | Freq: Every day | RESPIRATORY_TRACT | 2 refills | Status: DC

## 2020-07-17 MED ORDER — TRELEGY ELLIPTA 100-62.5-25 MCG/INH IN AEPB: 1.0000 | INHALATION_SPRAY | Freq: Every day | RESPIRATORY_TRACT | 0 refills | Status: DC

## 2020-07-17 MED ORDER — MONTELUKAST SODIUM 10 MG PO TABS: 10.0000 mg | ORAL_TABLET | Freq: Every day | ORAL | 11 refills | Status: DC

## 2020-07-17 MED ORDER — ALBUTEROL SULFATE HFA 108 (90 BASE) MCG/ACT IN AERS: 2.0000 | INHALATION_SPRAY | Freq: Four times a day (QID) | RESPIRATORY_TRACT | 6 refills | Status: DC | PRN

## 2020-07-17 NOTE — Progress Notes (Signed)
Synopsis: Referred in April 2022 for COPD by Heather RobertsGray, Joseph M, NP  Subjective:   PATIENT ID: [name] GENDER: female DOB: April 26, 1970, MRN: [id number]  Chief Complaint  Patient presents with  . Consult    COPD/Asthma, having SHOB with exertion.  Has been out of her medications for about 1 week.     PMH of asthma and copd, HTN, DVT (2011 and 2014), currently not on a blood thinner, history of sepsis and empyema. She was admitted to the hospital and was on ventilator for a time being. She moved to [place] and followed with Dr. Orson AloeHenderson in [place]. Prior to that she saw Dr. Delton CoombesByrum. COPD is currently management with spiriva and advair.  Additionally she is taking theophylline.  She does not take this every day.  She is on 400 mg daily.  She also is still smoking a half a pack a day.  She was able to quit smoking when she was 50 years old for a year.  But otherwise has smoked for the past 35 years.  At her max was 2 packs/day and currently down to half a pack a day.  She has not had lung cancer screening.  We talked about this today in the office.    Past Medical History:  Diagnosis Date  . ASTHMA 02/11/2008   Qualifier: Diagnosis of  By: Laural BenesJohnson RN, Cicero DuckErika    . Cholelithiasis   . COPD (chronic obstructive pulmonary disease) (HCC)   . DVT 12/18/2008   Qualifier: Diagnosis of  By: Delton CoombesByrum MD, Les Pouobert S   . DVT (deep venous thrombosis) (HCC)    once occurred after trauma,completed coumadin 05/2009  . Empyema   . GERD (gastroesophageal reflux disease)   . Hypertension   . Insomnia   . Lung abscess (HCC) 2009  . METHICILLIN SUSECPTIBLE PNEUMONIA STAPH AUREUS 02/11/2008   Qualifier: Diagnosis of  By: Laural BenesJohnson RN, Cicero DuckErika    . MSSA (methicillin susceptible Staphylococcus aureus) pneumonia (HCC)   . Pulmonary nodules   . Rectal bleeding 05/16/2011     Family History  Problem Relation Age of Onset  . Heart disease Mother   . Diabetes Mother   . Colon cancer Father   . Throat cancer Father   .  Breast cancer Sister      Past Surgical History:  Procedure Laterality Date  . BREAST BIOPSY  2014   left breast lumpectomy for "cancer cells"  . CESAREAN SECTION  2005   x2  . Chest tube placement  2009    Social History   Socioeconomic History  . Marital status: Single    Spouse name: Not on file  . Number of children: 6  . Years of education: Not on file  . Highest education level: Not on file  Occupational History  . Occupation: homemaker  Tobacco Use  . Smoking status: Current Every Day Smoker    Packs/day: 0.50    Years: 26.00    Pack years: 13.00    Types: Cigarettes  . Smokeless tobacco: Never Used  . Tobacco comment: 1 ciggs a day. Started at age 50.  Vaping Use  . Vaping Use: Never used  Substance and Sexual Activity  . Alcohol use: Yes    Comment: mixed drink 1-2 times/month  . Drug use: No  . Sexual activity: Not Currently    Birth control/protection: None  Other Topics Concern  . Not on file  Social History Narrative  . Not on file   Social Determinants of  Health   Financial Resource Strain: Not on file  Food Insecurity: Not on file  Transportation Needs: Not on file  Physical Activity: Not on file  Stress: Not on file  Social Connections: Not on file  Intimate Partner Violence: Not on file     Allergies  Allergen Reactions  . Cefaclor   . Penicillins      Outpatient Medications Prior to Visit  Medication Sig Dispense Refill  . albuterol (PROVENTIL) (2.5 MG/3ML) 0.083% nebulizer solution Take 2.5 mg by nebulization every 6 (six) hours as needed.    . cetirizine (ZYRTEC) 10 MG tablet     . Fluticasone-Salmeterol (ADVAIR) 250-50 MCG/DOSE AEPB Inhale 1 puff into the lungs 2 (two) times daily.    . metoprolol tartrate (LOPRESSOR) 25 MG tablet Take 1.5 tablets (37.5 mg total) by mouth daily. 180 tablet 3  . omeprazole (PRILOSEC) 40 MG capsule Take 40 mg by mouth daily.    . OXYGEN Inhale into the lungs. 4 liters at bedtime    . SPIRIVA  HANDIHALER 18 MCG inhalation capsule INHALE CONTENTS OF ONE CAPSULE BY MOUTH ONCE DAILY 30 capsule 3  . theophylline (THEO-24) 400 MG 24 hr capsule Take 400 mg by mouth daily.    . VENTOLIN HFA 108 (90 BASE) MCG/ACT inhaler INHALE 2 PUFFS INTO LUNGS EVERY 4 HOURS AS NEEDED FOR WHEEZING 18 each 3  . Vitamin D, Ergocalciferol, (DRISDOL) 1.25 MG (50000 UNIT) CAPS capsule Take 1 capsule (50,000 Units total) by mouth every 7 (seven) days. 8 capsule 0  . zolpidem (AMBIEN) 5 MG tablet Take 1 tablet (5 mg total) by mouth at bedtime as needed for sleep. 30 tablet 2  . clotrimazole (LOTRIMIN) 1 % cream Apply 1 application topically 2 (two) times daily. (Patient not taking: Reported on 07/17/2020) 30 g 0   No facility-administered medications prior to visit.    Review of Systems  Constitutional: Negative for chills, fever, malaise/fatigue and weight loss.  HENT: Negative for hearing loss, sore throat and tinnitus.   Eyes: Negative for blurred vision and double vision.  Respiratory: Positive for cough and shortness of breath. Negative for hemoptysis, sputum production, wheezing and stridor.   Cardiovascular: Negative for chest pain, palpitations, orthopnea, leg swelling and PND.  Gastrointestinal: Negative for abdominal pain, constipation, diarrhea, heartburn, nausea and vomiting.  Genitourinary: Negative for dysuria, hematuria and urgency.  Musculoskeletal: Negative for joint pain and myalgias.  Skin: Negative for itching and rash.  Neurological: Negative for dizziness, tingling, weakness and headaches.  Endo/Heme/Allergies: Negative for environmental allergies. Does not bruise/bleed easily.  Psychiatric/Behavioral: Negative for depression. The patient is not nervous/anxious and does not have insomnia.   All other systems reviewed and are negative.    Objective:  Physical Exam Vitals reviewed.  Constitutional:      General: She is not in acute distress.    Appearance: She is well-developed.   HENT:     Head: Normocephalic and atraumatic.  Eyes:     General: No scleral icterus.    Conjunctiva/sclera: Conjunctivae normal.     Pupils: Pupils are equal, round, and reactive to light.  Neck:     Vascular: No JVD.     Trachea: No tracheal deviation.  Cardiovascular:     Rate and Rhythm: Normal rate and regular rhythm.     Heart sounds: Normal heart sounds. No murmur heard.   Pulmonary:     Effort: Pulmonary effort is normal. No tachypnea, accessory muscle usage or respiratory distress.  Breath sounds: No stridor. Wheezing present. No rhonchi or rales.     Comments: Diminished breath sounds bilaterally Abdominal:     General: Bowel sounds are normal. There is no distension.     Palpations: Abdomen is soft.     Tenderness: There is no abdominal tenderness.  Musculoskeletal:        General: No tenderness.     Cervical back: Neck supple.  Lymphadenopathy:     Cervical: No cervical adenopathy.  Skin:    General: Skin is warm and dry.     Capillary Refill: Capillary refill takes less than 2 seconds.     Findings: No rash.  Neurological:     Mental Status: She is alert and oriented to person, place, and time.  Psychiatric:        Behavior: Behavior normal.      Vitals:   07/17/20 1059  BP: 136/80  Pulse: (!) 102  Temp: 98.6 F (37 C)  TempSrc: Tympanic  SpO2: (!) 88%  Weight: 150 lb 4 oz (68.2 kg)  Height: 5\' 5"  (1.651 m)   (!) 88% on RA BMI Readings from Last 3 Encounters:  07/17/20 25.00 kg/m  07/06/20 26.09 kg/m  06/22/20 25.29 kg/m   Wt Readings from Last 3 Encounters:  07/17/20 150 lb 4 oz (68.2 kg)  07/06/20 152 lb (68.9 kg)  06/22/20 152 lb (68.9 kg)    CBC    Component Value Date/Time   WBC 6.1 06/29/2020 0959   WBC 7.5 05/16/2011 1203   RBC 5.51 (H) 06/29/2020 0959   RBC 4.21 05/16/2011 1203   HGB 16.7 (H) 06/29/2020 0959   HCT 51.6 (H) 06/29/2020 0959   PLT 260 06/29/2020 0959   MCV 94 06/29/2020 0959   MCH 30.3 06/29/2020 0959    MCHC 32.4 06/29/2020 0959   MCHC 33.5 05/16/2011 1203   RDW 14.5 06/29/2020 0959   LYMPHSABS 1.7 06/29/2020 0959   MONOABS 0.2 05/31/2009 0321   EOSABS 0.2 06/29/2020 0959   BASOSABS 0.0 06/29/2020 0959    Chest Imaging: No recent CXR   Pulmonary Functions Testing Results: No flowsheet data found.  FeNO:   Pathology:   Echocardiogram:   Heart Catheterization:     Assessment & Plan:     ICD-10-CM   1. Current smoker  F17.200 Ambulatory Referral for Lung Cancer Scre  2. Chronic obstructive pulmonary disease, unspecified COPD type (HCC)  J44.9 Pulmonary Function Test  3. Seasonal allergies  J30.2   4. Wheezing  R06.2   5. Encounter for smoking cessation counseling  Z71.6     Discussion:  This is a 50 year old female, current smoker, labeled with COPD, no PFTs on file.  She has significant seasonal allergies takes antihistamines at this time. Currently on triple therapy inhaler.  Plus theophylline.  Plan: Recommend her tapering off theophylline. She is not currently taking this regularly anyway Continue antihistamines daily Add Singulair for seasonal allergy symptoms. Start Trelegy Samples of Trelegy today Inhaler education on appropriate technique today. Refills of albuterol. Smoking cessation counseling please see separate documentation. Referral to lung cancer screening program Full PFTs prior to next office visit.  Return to see 44 in 3 months or as needed before then.    Current Outpatient Medications:  .  albuterol (PROVENTIL) (2.5 MG/3ML) 0.083% nebulizer solution, Take 2.5 mg by nebulization every 6 (six) hours as needed., Disp: , Rfl:  .  cetirizine (ZYRTEC) 10 MG tablet, , Disp: , Rfl:  .  Fluticasone-Salmeterol (ADVAIR) 250-50 MCG/DOSE  AEPB, Inhale 1 puff into the lungs 2 (two) times daily., Disp: , Rfl:  .  metoprolol tartrate (LOPRESSOR) 25 MG tablet, Take 1.5 tablets (37.5 mg total) by mouth daily., Disp: 180 tablet, Rfl: 3 .  omeprazole  (PRILOSEC) 40 MG capsule, Take 40 mg by mouth daily., Disp: , Rfl:  .  OXYGEN, Inhale into the lungs. 4 liters at bedtime, Disp: , Rfl:  .  SPIRIVA HANDIHALER 18 MCG inhalation capsule, INHALE CONTENTS OF ONE CAPSULE BY MOUTH ONCE DAILY, Disp: 30 capsule, Rfl: 3 .  theophylline (THEO-24) 400 MG 24 hr capsule, Take 400 mg by mouth daily., Disp: , Rfl:  .  VENTOLIN HFA 108 (90 BASE) MCG/ACT inhaler, INHALE 2 PUFFS INTO LUNGS EVERY 4 HOURS AS NEEDED FOR WHEEZING, Disp: 18 each, Rfl: 3 .  Vitamin D, Ergocalciferol, (DRISDOL) 1.25 MG (50000 UNIT) CAPS capsule, Take 1 capsule (50,000 Units total) by mouth every 7 (seven) days., Disp: 8 capsule, Rfl: 0 .  zolpidem (AMBIEN) 5 MG tablet, Take 1 tablet (5 mg total) by mouth at bedtime as needed for sleep., Disp: 30 tablet, Rfl: 2   Josephine Igo, DO [facility] 07/17/2020 11:18 AM

## 2020-07-17 NOTE — Addendum Note (Signed)
Addended by: Marcellus Scott on: 07/17/2020 11:58 AM   Modules accepted: Orders

## 2020-07-17 NOTE — Patient Instructions (Addendum)
Thank you for visiting Dr. Tonia Brooms at [facility]. Today we recommend the following:  Orders Placed This Encounter  Procedures  . Ambulatory Referral for Lung Cancer Scre  . Pulmonary Function Test   Meds ordered this encounter  Medications  . Fluticasone-Umeclidin-Vilant (TRELEGY ELLIPTA) 100-62.5-25 MCG/INH AEPB    Sig: Inhale 1 puff into the lungs daily.    Dispense:  3 each    Refill:  2  . albuterol (VENTOLIN HFA) 108 (90 Base) MCG/ACT inhaler    Sig: Inhale 2 puffs into the lungs every 6 (six) hours as needed for wheezing or shortness of breath.    Dispense:  8 g    Refill:  6  . montelukast (SINGULAIR) 10 MG tablet    Sig: Take 1 tablet (10 mg total) by mouth at bedtime.    Dispense:  30 tablet    Refill:  11   Return in about 3 months (around 10/16/2020) for with APP or Dr. Tonia Brooms.    Please do your part to reduce the spread of COVID-19.

## 2020-07-17 NOTE — Progress Notes (Signed)
Smoking Cessation Counseling:   The patient's current tobacco use: 0.5ppd, max was 2 ppd The patient was advised to quit and impact of smoking on their health.  I assessed the patient's willingness to attempt to quit. I provided methods and skills for cessation. We reviewed medication management of smoking session drugs if appropriate. Resources to help quit smoking were provided. A smoking cessation quit date was set: June 15th, 2022 Follow-up was arranged in our clinic.  The amount of time spent counseling patient was 4 mins    Josephine Igo, DO [facility] 07/17/2020 11:25 AM      Inhaler education: Patient was instructed on appropriate inhaler use.  Patient demonstrated appropriate inhaler technique with new Ellipta device.  Josephine Igo, DO [facility] 07/17/2020 11:36 AM

## 2020-07-23 ENCOUNTER — Telehealth: Payer: Self-pay | Admitting: Acute Care

## 2020-07-23 NOTE — Telephone Encounter (Signed)
Spoke with pt regarding lung cancer screening. I advised pt that Medicaid does not cover this but we can get her scheduled through the grant. Pt wants to do this. I told pt I will get with Kandice Robinsons, NP to see when she can do her shared decision televisit and I will call her back to let her know. Pt verbalized understanding. Will close this message and refer to referral notes .

## 2020-07-28 ENCOUNTER — Emergency Department (HOSPITAL_COMMUNITY)
Admission: EM | Admit: 2020-07-28 | Discharge: 2020-07-28 | Discharge status: Against Medical Advice | Payer: Medicaid Other | Attending: Emergency Medicine | Admitting: Emergency Medicine

## 2020-07-28 ENCOUNTER — Other Ambulatory Visit: Payer: Self-pay

## 2020-07-28 ENCOUNTER — Emergency Department (HOSPITAL_COMMUNITY): Payer: Medicaid Other

## 2020-07-28 ENCOUNTER — Encounter (HOSPITAL_COMMUNITY): Payer: Self-pay | Admitting: Emergency Medicine

## 2020-07-28 DIAGNOSIS — Z5320 Procedure and treatment not carried out because of patient's decision for unspecified reasons: Secondary | ICD-10-CM | POA: Insufficient documentation

## 2020-07-28 DIAGNOSIS — Z79899 Other long term (current) drug therapy: Secondary | ICD-10-CM | POA: Insufficient documentation

## 2020-07-28 DIAGNOSIS — J45909 Unspecified asthma, uncomplicated: Secondary | ICD-10-CM | POA: Insufficient documentation

## 2020-07-28 DIAGNOSIS — J441 Chronic obstructive pulmonary disease with (acute) exacerbation: Secondary | ICD-10-CM | POA: Diagnosis not present

## 2020-07-28 DIAGNOSIS — Z20822 Contact with and (suspected) exposure to covid-19: Secondary | ICD-10-CM | POA: Insufficient documentation

## 2020-07-28 DIAGNOSIS — I10 Essential (primary) hypertension: Secondary | ICD-10-CM | POA: Insufficient documentation

## 2020-07-28 DIAGNOSIS — R0602 Shortness of breath: Secondary | ICD-10-CM | POA: Diagnosis present

## 2020-07-28 DIAGNOSIS — Z87891 Personal history of nicotine dependence: Secondary | ICD-10-CM | POA: Insufficient documentation

## 2020-07-28 LAB — CBC WITH DIFFERENTIAL/PLATELET
Abs Immature Granulocytes: 0.11 K/uL — ABNORMAL HIGH (ref 0.00–0.07)
Basophils Absolute: 0.1 K/uL (ref 0.0–0.1)
Basophils Relative: 0 %
Eosinophils Absolute: 0.2 K/uL (ref 0.0–0.5)
Eosinophils Relative: 1 %
HCT: 44.8 % (ref 36.0–46.0)
Hemoglobin: 14.5 g/dL (ref 12.0–15.0)
Immature Granulocytes: 1 %
Lymphocytes Relative: 10 %
Lymphs Abs: 1.7 K/uL (ref 0.7–4.0)
MCH: 31.3 pg (ref 26.0–34.0)
MCHC: 32.4 g/dL (ref 30.0–36.0)
MCV: 96.6 fL (ref 80.0–100.0)
Monocytes Absolute: 1.2 K/uL — ABNORMAL HIGH (ref 0.1–1.0)
Monocytes Relative: 8 %
Neutro Abs: 12.8 K/uL — ABNORMAL HIGH (ref 1.7–7.7)
Neutrophils Relative %: 80 %
Platelets: 246 K/uL (ref 150–400)
RBC: 4.64 MIL/uL (ref 3.87–5.11)
RDW: 15.6 % — ABNORMAL HIGH (ref 11.5–15.5)
WBC: 16.1 K/uL — ABNORMAL HIGH (ref 4.0–10.5)
nRBC: 0 % (ref 0.0–0.2)

## 2020-07-28 LAB — COMPREHENSIVE METABOLIC PANEL
ALT: 12 U/L (ref 0–44)
AST: 14 U/L — ABNORMAL LOW (ref 15–41)
Albumin: 3.3 g/dL — ABNORMAL LOW (ref 3.5–5.0)
Alkaline Phosphatase: 80 U/L (ref 38–126)
Anion gap: 9 (ref 5–15)
BUN: 8 mg/dL (ref 6–20)
CO2: 26 mmol/L (ref 22–32)
Calcium: 8.4 mg/dL — ABNORMAL LOW (ref 8.9–10.3)
Chloride: 100 mmol/L (ref 98–111)
Creatinine, Ser: 0.53 mg/dL (ref 0.44–1.00)
GFR, Estimated: >60 mL/min (ref >60)
Glucose, Bld: 101 mg/dL — ABNORMAL HIGH (ref 70–99)
Potassium: 3.9 mmol/L (ref 3.5–5.1)
Sodium: 135 mmol/L (ref 135–145)
Total Bilirubin: 0.9 mg/dL (ref 0.3–1.2)
Total Protein: 6.9 g/dL (ref 6.5–8.1)

## 2020-07-28 LAB — RESP PANEL BY RT-PCR (FLU A&B, COVID) ARPGX2
Influenza A by PCR: NEGATIVE
Influenza B by PCR: NEGATIVE
SARS Coronavirus 2 by RT PCR: NEGATIVE

## 2020-07-28 LAB — LACTIC ACID, PLASMA: Lactic Acid, Venous: 0.6 mmol/L (ref 0.5–1.9)

## 2020-07-28 MED ORDER — SODIUM CHLORIDE 0.9 % IV BOLUS
500.0000 mL | Freq: Once | INTRAVENOUS | Status: AC
  Administered 2020-07-28: 500 mL via INTRAVENOUS

## 2020-07-28 MED ORDER — MAGNESIUM SULFATE 2 GM/50ML IV SOLN
2.0000 g | Freq: Once | INTRAVENOUS | Status: AC
  Administered 2020-07-28: 2 g via INTRAVENOUS
  Filled 2020-07-28: qty 50

## 2020-07-28 MED ORDER — METHYLPREDNISOLONE SODIUM SUCC 125 MG IJ SOLR
125.0000 mg | Freq: Once | INTRAMUSCULAR | Status: AC
  Administered 2020-07-28: 125 mg via INTRAVENOUS
  Filled 2020-07-28: qty 2

## 2020-07-28 NOTE — ED Provider Notes (Signed)
[facility] EMERGENCY DEPARTMENT Provider Note   CSN: [id number] Arrival date & time: 07/28/20  1034     History Chief Complaint  Patient presents with  . Shortness of Breath    [name] is a 50 y.o. female.  Patient complains of cough congestion shortness of breath.  She is normally on 4 L for COPD  The history is provided by the patient and medical records. No language interpreter was used.  Shortness of Breath Severity:  Moderate Onset quality:  Gradual Timing:  Constant Progression:  Waxing and waning Chronicity:  Recurrent Context: activity   Relieved by:  Nothing Worsened by:  Nothing Ineffective treatments:  None tried Associated symptoms: no abdominal pain, no chest pain, no cough, no headaches and no rash        Past Medical History:  Diagnosis Date  . ASTHMA 02/11/2008   Qualifier: Diagnosis of  By: Laural Benes RN, Cicero Duck    . Cholelithiasis   . COPD (chronic obstructive pulmonary disease) (HCC)   . DVT 12/18/2008   Qualifier: Diagnosis of  By: Delton Coombes MD, Les Pou   . DVT (deep venous thrombosis) (HCC)    once occurred after trauma,completed coumadin 05/2009  . Empyema   . GERD (gastroesophageal reflux disease)   . Hypertension   . Insomnia   . Lung abscess (HCC) 2009  . METHICILLIN SUSECPTIBLE PNEUMONIA STAPH AUREUS 02/11/2008   Qualifier: Diagnosis of  By: Laural Benes RN, Cicero Duck    . MSSA (methicillin susceptible Staphylococcus aureus) pneumonia (HCC)   . Pulmonary nodules   . Rectal bleeding 05/16/2011    Patient Active Problem List   Diagnosis Date Noted  . Angular cheilitis 07/06/2020  . Tachycardia 06/22/2020  . Insomnia 06/22/2020  . Vitamin D deficiency 06/22/2020  . Encounter to establish care 06/22/2020  . Screening due 06/22/2020  . Constipation 08/09/2019  . Abdominal bloating 08/09/2019  . Abdominal pain, epigastric 08/09/2019  . FH: colon cancer 08/09/2019  . History of colonic polyps 08/09/2019  . GERD (gastroesophageal reflux  disease) 07/29/2011  . COPD (chronic obstructive pulmonary disease) (HCC) 06/17/2008  . ANEMIA 02/11/2008  . TOBACCO ABUSE 02/11/2008    Past Surgical History:  Procedure Laterality Date  . BREAST BIOPSY  2014   left breast lumpectomy for "cancer cells"  . CESAREAN SECTION  2005   x2  . Chest tube placement  2009     OB History   No obstetric history on file.     Family History  Problem Relation Age of Onset  . Heart disease Mother   . Diabetes Mother   . Colon cancer Father   . Throat cancer Father   . Breast cancer Sister     Social History   Tobacco Use  . Smoking status: Current Every Day Smoker    Packs/day: 0.50    Years: 26.00    Pack years: 13.00    Types: Cigarettes  . Smokeless tobacco: Never Used  . Tobacco comment: 1 ciggs a day. Started at age 26.  Vaping Use  . Vaping Use: Never used  Substance Use Topics  . Alcohol use: Yes    Comment: mixed drink 1-2 times/month  . Drug use: No    Home Medications Prior to Admission medications   Medication Sig Start Date End Date Taking? Authorizing Provider  albuterol (PROVENTIL) (2.5 MG/3ML) 0.083% nebulizer solution Take 2.5 mg by nebulization every 6 (six) hours as needed.    [provider]  albuterol (VENTOLIN HFA) 108 (90  Base) MCG/ACT inhaler Inhale 2 puffs into the lungs every 6 (six) hours as needed for wheezing or shortness of breath. 07/17/20   Josephine Igo, DO  cetirizine (ZYRTEC) 10 MG tablet  06/22/20   [provider]  Fluticasone-Salmeterol (ADVAIR) 250-50 MCG/DOSE AEPB Inhale 1 puff into the lungs 2 (two) times daily.    [provider]  Fluticasone-Umeclidin-Vilant (TRELEGY ELLIPTA) 100-62.5-25 MCG/INH AEPB Inhale 1 puff into the lungs daily. 07/17/20 10/15/20  Josephine Igo, DO  Fluticasone-Umeclidin-Vilant (TRELEGY ELLIPTA) 100-62.5-25 MCG/INH AEPB Inhale 1 puff into the lungs daily. 07/17/20   Audie Box L, DO  metoprolol tartrate (LOPRESSOR) 25 MG tablet Take  1.5 tablets (37.5 mg total) by mouth daily. 06/22/20   Heather Roberts, NP  montelukast (SINGULAIR) 10 MG tablet Take 1 tablet (10 mg total) by mouth at bedtime. 07/17/20   Icard, Rachel Bo, DO  omeprazole (PRILOSEC) 40 MG capsule Take 40 mg by mouth daily.    [provider]  OXYGEN Inhale into the lungs. 4 liters at bedtime    [provider]  SPIRIVA HANDIHALER 18 MCG inhalation capsule INHALE CONTENTS OF ONE CAPSULE BY MOUTH ONCE DAILY 12/28/11   Leslye Peer, MD  theophylline (THEO-24) 400 MG 24 hr capsule Take 400 mg by mouth daily.    [provider]  VENTOLIN HFA 108 (90 BASE) MCG/ACT inhaler INHALE 2 PUFFS INTO LUNGS EVERY 4 HOURS AS NEEDED FOR WHEEZING 06/09/12   Byrum, Les Pou, MD  Vitamin D, Ergocalciferol, (DRISDOL) 1.25 MG (50000 UNIT) CAPS capsule Take 1 capsule (50,000 Units total) by mouth every 7 (seven) days. 07/06/20   Heather Roberts, NP  zolpidem (AMBIEN) 5 MG tablet Take 1 tablet (5 mg total) by mouth at bedtime as needed for sleep. 06/22/20   Heather Roberts, NP    Allergies    Cefaclor and Penicillins  Review of Systems   Review of Systems  Constitutional: Negative for appetite change and fatigue.  HENT: Negative for congestion, ear discharge and sinus pressure.   Eyes: Negative for discharge.  Respiratory: Positive for shortness of breath. Negative for cough.   Cardiovascular: Negative for chest pain.  Gastrointestinal: Negative for abdominal pain and diarrhea.  Genitourinary: Negative for frequency and hematuria.  Musculoskeletal: Negative for back pain.  Skin: Negative for rash.  Neurological: Negative for seizures and headaches.  Psychiatric/Behavioral: Negative for hallucinations.    Physical Exam Updated Vital Signs BP 114/74   Pulse 94   Temp 99.4 F (37.4 C) (Oral)   Resp 20   Ht 5\' 5"  (1.651 m)   Wt 66.7 kg   SpO2 94%   BMI 24.46 kg/m   Physical Exam Vitals and nursing note reviewed.  Constitutional:      Appearance:  She is well-developed.  HENT:     Head: Normocephalic.  Eyes:     General: No scleral icterus.    Conjunctiva/sclera: Conjunctivae normal.  Neck:     Thyroid: No thyromegaly.  Cardiovascular:     Rate and Rhythm: Normal rate and regular rhythm.     Heart sounds: No murmur heard. No friction rub. No gallop.   Pulmonary:     Breath sounds: No stridor. Wheezing present. No rales.  Chest:     Chest wall: No tenderness.  Abdominal:     General: There is no distension.     Tenderness: There is no abdominal tenderness. There is no rebound.  Musculoskeletal:        General:  Normal range of motion.     Cervical back: Neck supple.  Lymphadenopathy:     Cervical: No cervical adenopathy.  Skin:    Findings: No erythema or rash.  Neurological:     Mental Status: She is alert and oriented to person, place, and time.     Motor: No abnormal muscle tone.     Coordination: Coordination normal.  Psychiatric:        Behavior: Behavior normal.     ED Results / Procedures / Treatments   Labs (all labs ordered are listed, but only abnormal results are displayed) Labs Reviewed  CBC WITH DIFFERENTIAL/PLATELET - Abnormal; Notable for the following components:      Result Value   WBC 16.1 (*)    RDW 15.6 (*)    Neutro Abs 12.8 (*)    Monocytes Absolute 1.2 (*)    Abs Immature Granulocytes 0.11 (*)    All other components within normal limits  COMPREHENSIVE METABOLIC PANEL - Abnormal; Notable for the following components:   Glucose, Bld 101 (*)    Calcium 8.4 (*)    Albumin 3.3 (*)    AST 14 (*)    All other components within normal limits  RESP PANEL BY RT-PCR (FLU A&B, COVID) [id number]  LACTIC ACID, PLASMA    EKG None  Radiology DG Chest Port 1 View  Result Date: 07/28/2020 CLINICAL DATA:  Shortness of breath and chest pain EXAM: PORTABLE CHEST 1 VIEW COMPARISON:  05/11/2011 FINDINGS: Cardiac shadow is within normal limits. Lungs are hyperinflated bilaterally. Bibasilar atelectatic  changes are noted left greater than right. No sizable effusion is seen. No bony abnormality is noted. IMPRESSION: Bibasilar atelectatic changes left greater than right increased from the prior exam. Electronically Signed   By: Alcide Clever M.D.   On: 07/28/2020 12:05    Procedures Procedures   Medications Ordered in ED Medications  methylPREDNISolone sodium succinate (SOLU-MEDROL) 125 mg/2 mL injection 125 mg (125 mg Intravenous Given 07/28/20 1214)  magnesium sulfate IVPB 2 g 50 mL (0 g Intravenous Stopped 07/28/20 1507)  sodium chloride 0.9 % bolus 500 mL (0 mLs Intravenous Stopped 07/28/20 1507)    ED Course  I have reviewed the triage vital signs and the nursing notes.  Pertinent labs & imaging results that were available during my care of the patient were reviewed by me and considered in my medical decision making (see chart for details).    MDM Rules/Calculators/A&P                         Patient with possible pneumonia or COPD exacerbation with URI.  Patient's Covid test is pending.  Patient left AMA and did not explain anybody while she was leaving Final Clinical Impression(s) / ED Diagnoses Final diagnoses:  COPD exacerbation (HCC)    Rx / DC Orders ED Discharge Orders    None       Bethann Berkshire, MD 07/29/20 1800

## 2020-07-28 NOTE — ED Triage Notes (Signed)
Pt c/o severe SOB, cough, fever since yesterday morning. Hx of COPD and asthma. Labored breathing in triage. O2 sat 84% in triage on RA.

## 2020-07-28 NOTE — ED Notes (Signed)
Pt walked out of ER.  She was tired of waiting.  Pt ambulated to front lobby stated she took her own IV out and left AMA.

## 2020-07-29 ENCOUNTER — Ambulatory Visit: Payer: Medicaid Other | Admitting: Acute Care

## 2020-07-29 ENCOUNTER — Telehealth: Payer: Self-pay | Admitting: Pulmonary Disease

## 2020-07-29 MED ORDER — FLUTICASONE-SALMETEROL 250-50 MCG/DOSE IN AEPB: 1.0000 | INHALATION_SPRAY | Freq: Every day | RESPIRATORY_TRACT | 5 refills | Status: DC

## 2020-07-29 MED ORDER — TIOTROPIUM BROMIDE MONOHYDRATE 18 MCG IN CAPS: 18.0000 ug | ORAL_CAPSULE | Freq: Every day | RESPIRATORY_TRACT | 6 refills | Status: DC

## 2020-07-29 MED ORDER — AZITHROMYCIN 250 MG PO TABS: 250.0000 mg | ORAL_TABLET | ORAL | 11 refills | Status: DC

## 2020-07-29 NOTE — Telephone Encounter (Signed)
Yes can restart advair and spiriva. But I do not think trelegy is causing her problems.   Ok to refill azithromycin 250mg  MWF.  She needs to follow up with next available acute visit with APP if we have any slots next week.   Thanks  , DO [facility] 07/29/2020 11:34 AM

## 2020-07-29 NOTE — Telephone Encounter (Signed)
Primary Pulmonologist: Icard Last office visit and with whom: 07/17/20 Icard What do we see them for (pulmonary problems): COPD, current smoker Last OV assessment/plan:  Assessment & Plan:     ICD-10-CM   1. Current smoker  F17.200 Ambulatory Referral for Lung Cancer Scre  2. Chronic obstructive pulmonary disease, unspecified COPD type (HCC)  J44.9 Pulmonary Function Test  3. Seasonal allergies  J30.2   4. Wheezing  R06.2   5. Encounter for smoking cessation counseling  Z71.6     Discussion:  This is a 50 year old female, current smoker, labeled with COPD, no PFTs on file.  She has significant seasonal allergies takes antihistamines at this time. Currently on triple therapy inhaler.  Plus theophylline.  Plan: Recommend her tapering off theophylline. She is not currently taking this regularly anyway Continue antihistamines daily Add Singulair for seasonal allergy symptoms. Start Trelegy Samples of Trelegy today Inhaler education on appropriate technique today. Refills of albuterol. Smoking cessation counseling please see separate documentation. Referral to lung cancer screening program Full PFTs prior to next office visit.  Return to see [place] in 3 months or as needed before then.    Current Outpatient Medications:  .  albuterol (PROVENTIL) (2.5 MG/3ML) 0.083% nebulizer solution, Take 2.5 mg by nebulization every 6 (six) hours as needed., Disp: , Rfl:  .  cetirizine (ZYRTEC) 10 MG tablet, , Disp: , Rfl:  .  Fluticasone-Salmeterol (ADVAIR) 250-50 MCG/DOSE AEPB, Inhale 1 puff into the lungs 2 (two) times daily., Disp: , Rfl:  .  metoprolol tartrate (LOPRESSOR) 25 MG tablet, Take 1.5 tablets (37.5 mg total) by mouth daily., Disp: 180 tablet, Rfl: 3 .  omeprazole (PRILOSEC) 40 MG capsule, Take 40 mg by mouth daily., Disp: , Rfl:  .  OXYGEN, Inhale into the lungs. 4 liters at bedtime, Disp: , Rfl:  .  SPIRIVA HANDIHALER 18 MCG inhalation capsule, INHALE CONTENTS OF ONE  CAPSULE BY MOUTH ONCE DAILY, Disp: 30 capsule, Rfl: 3 .  theophylline (THEO-24) 400 MG 24 hr capsule, Take 400 mg by mouth daily., Disp: , Rfl:  .  VENTOLIN HFA 108 (90 BASE) MCG/ACT inhaler, INHALE 2 PUFFS INTO LUNGS EVERY 4 HOURS AS NEEDED FOR WHEEZING, Disp: 18 each, Rfl: 3 .  Vitamin D, Ergocalciferol, (DRISDOL) 1.25 MG (50000 UNIT) CAPS capsule, Take 1 capsule (50,000 Units total) by mouth every 7 (seven) days., Disp: 8 capsule, Rfl: 0 .  zolpidem (AMBIEN) 5 MG tablet, Take 1 tablet (5 mg total) by mouth at bedtime as needed for sleep., Disp: 30 tablet, Rfl: 2   Josephine Igo, DO [facility] 07/17/2020 11:18 AM         Patient Instructions by Josephine Igo, DO at 07/17/2020 11:00 AM  Author: Josephine Igo, DO Author Type: Physician Filed: 07/17/2020 11:30 AM  Note Status: Addendum Cosign: Cosign Not Required Encounter Date: 07/17/2020  Editor: Josephine Igo, DO (Physician)      Prior Versions: 1. Josephine Igo, DO (Physician) at 07/17/2020 11:28 AM - Addendum   2. Josephine Igo, DO (Physician) at 07/17/2020 11:26 AM - Signed    Thank you for visiting Dr. Tonia Brooms at [facility]. Today we recommend the following:     Orders Placed This Encounter  Procedures  . Ambulatory Referral for Lung Cancer Scre  . Pulmonary Function Test       Meds ordered this encounter  Medications  . Fluticasone-Umeclidin-Vilant (TRELEGY ELLIPTA) 100-62.5-25 MCG/INH AEPB    Sig: Inhale 1 puff into  the lungs daily.    Dispense:  3 each    Refill:  2  . albuterol (VENTOLIN HFA) 108 (90 Base) MCG/ACT inhaler    Sig: Inhale 2 puffs into the lungs every 6 (six) hours as needed for wheezing or shortness of breath.    Dispense:  8 g    Refill:  6  . montelukast (SINGULAIR) 10 MG tablet    Sig: Take 1 tablet (10 mg total) by mouth at bedtime.    Dispense:  30 tablet    Refill:  11   Return in about 3 months (around 10/16/2020) for with APP or Dr.  Tonia Brooms.         Reason for call: Started on on Trelegy on 07/17/20, stopped Spiriva and Advair per Dr. Tonia Brooms.  States she had 3 days of fever (did not take her temperature, cannot find her thermometer), pain in left side of chest with swelling to left side.  Went to the ED yesterday 07/28/20, had a cxr that did not show any pna.  She feels the Trelegy inhaler is causing the pain.  States she is coughing, but not more that what is normal for her, states the mucous is brown/yellow/green.  Denies any current fever or body aches, but reports having chills and took ibuprofen this morning.  Using albuterol inhaler for sob with relief.  She has not had her covid vaccines.  States she needs a refill of Spiriva and Advair if she is to go back on these inhalers.  She ran out of her antibiotic, azithromycin that she take every MWF about 2 weeks ago and needs that refilled as well.  Dr. Tonia Brooms, please advise.  Thank you.  (examples of things to ask: : When did symptoms start? Fever? Cough? Productive? Color to sputum? More sputum than usual? Wheezing? Have you needed increased oxygen? Are you taking your respiratory medications? What over the counter measures have you tried?)  Allergies  Allergen Reactions  . Cefaclor   . Penicillins     Immunization History  Administered Date(s) Administered  . Influenza-Unspecified 01/30/2017

## 2020-07-29 NOTE — Telephone Encounter (Signed)
Called and spoke with patient, advised of recommendations per Dr. Tonia Brooms.  Scheduled OV with Sarah on Wednesday at 4:30 pm, advised to arrive by 4:15 pm.  Scripts sent to pharmacy after verified with patient.  Nothing further needed.

## 2020-08-31 ENCOUNTER — Other Ambulatory Visit: Payer: Self-pay | Admitting: *Deleted

## 2020-08-31 DIAGNOSIS — F17210 Nicotine dependence, cigarettes, uncomplicated: Secondary | ICD-10-CM

## 2020-08-31 DIAGNOSIS — Z87891 Personal history of nicotine dependence: Secondary | ICD-10-CM

## 2020-09-07 ENCOUNTER — Other Ambulatory Visit: Payer: Self-pay | Admitting: Nurse Practitioner

## 2020-09-07 ENCOUNTER — Other Ambulatory Visit: Payer: Medicaid Other | Admitting: Adult Health

## 2020-09-07 DIAGNOSIS — K130 Diseases of lips: Secondary | ICD-10-CM

## 2020-09-27 ENCOUNTER — Other Ambulatory Visit: Payer: Self-pay | Admitting: Nurse Practitioner

## 2020-09-27 DIAGNOSIS — K130 Diseases of lips: Secondary | ICD-10-CM

## 2020-09-28 ENCOUNTER — Telehealth: Payer: Self-pay | Admitting: Acute Care

## 2020-09-29 ENCOUNTER — Encounter: Payer: Medicaid Other | Admitting: Primary Care

## 2020-09-29 ENCOUNTER — Ambulatory Visit (HOSPITAL_COMMUNITY): Payer: Medicaid Other

## 2020-09-29 ENCOUNTER — Other Ambulatory Visit: Payer: Self-pay

## 2020-09-29 NOTE — Telephone Encounter (Signed)
Spoke with pt and advised that our [facility] ahs spoken with her insurance and the code for the low dose CT is a covered code and also per her insurance no PA is required (see referral notes). Pt will keep appt for [facility] and CT today. Nothing further needed at this time.

## 2020-09-29 NOTE — Telephone Encounter (Signed)
LMTC x 1 for Brittany 

## 2020-09-30 NOTE — Telephone Encounter (Signed)
LMTC x 1 for [name] 

## 2020-10-05 NOTE — Telephone Encounter (Signed)
Will close this message and refer to referral notes 

## 2020-10-12 ENCOUNTER — Other Ambulatory Visit: Payer: Self-pay

## 2020-10-12 ENCOUNTER — Ambulatory Visit (INDEPENDENT_AMBULATORY_CARE_PROVIDER_SITE_OTHER): Payer: Medicaid Other | Admitting: Nurse Practitioner

## 2020-10-12 ENCOUNTER — Encounter: Payer: Self-pay | Admitting: Nurse Practitioner

## 2020-10-12 ENCOUNTER — Telehealth: Payer: Self-pay | Admitting: Pulmonary Disease

## 2020-10-12 VITALS — BP 126/77 | HR 96 | Temp 98.0°F | Ht 65.0 in | Wt 147.0 lb

## 2020-10-12 DIAGNOSIS — J449 Chronic obstructive pulmonary disease, unspecified: Secondary | ICD-10-CM

## 2020-10-12 MED ORDER — ALPRAZOLAM 0.5 MG PO TABS: 0.5000 mg | ORAL_TABLET | Freq: Two times a day (BID) | ORAL | 0 refills | Status: DC | PRN

## 2020-10-12 MED ORDER — PREDNISONE 20 MG PO TABS: ORAL_TABLET | ORAL | 0 refills | Status: AC

## 2020-10-12 MED ORDER — AZITHROMYCIN 250 MG PO TABS: ORAL_TABLET | ORAL | 0 refills | Status: DC

## 2020-10-12 MED ORDER — IPRATROPIUM-ALBUTEROL 0.5-2.5 (3) MG/3ML IN SOLN: 3.0000 mL | RESPIRATORY_TRACT | 1 refills | Status: DC | PRN

## 2020-10-12 NOTE — Progress Notes (Signed)
O  Acute Office Visit  Subjective:    Patient ID: [name], female    DOB: 01-Oct-1970, 50 y.o.   MRN: [id number]  Chief Complaint  Patient presents with   Shortness of Breath    Ongoing x2 days ago, left the ER today     HPI Patient is in today for SOB and COPD exacerbation.   She left AMA from [facility] today. Currently, her O2 sat is 68%. She was requiring BiPAP while at [facility] according to the notes available.   CT Angio was complete after D-dimer was elevated, results are: "Cardiovascular: Mild coronary artery calcification. Global cardiac size within normal limits. No pericardial effusion. The central pulmonary arteries are enlarged in keeping with changes of pulmonary arterial hypertension. The thoracic aorta is unremarkable.  Mediastinum/Nodes: No enlarged mediastinal, hilar, or axillary lymph nodes. Thyroid gland, trachea, and esophagus demonstrate no significant findings.  Lungs/Pleura: Severe centrilobular emphysema. Biapical scarring is again identified. Nodular and ground-glass pulmonary infiltrates noted at the basilar right lower lobe and right middle lobe are stable since prior examination, likely reflecting residual post inflammatory or post infectious scarring. There is, however, interval development of focal nodular infiltrate within the left upper lobe anteriorly and, to a lesser extent, within the right upper lobe laterally which is indeterminate, possibly infectious or inflammatory in the acute setting. No pneumothorax or pleural effusion. No central obstructing lesion.  Upper Abdomen: No acute abnormality.  Musculoskeletal: No acute bone abnormality"  She was treated with Duo-nebs and azithromycin as well as ativan to help her tolerate the BiPAP.   Past Medical History:  Diagnosis Date   ASTHMA 02/11/2008   Qualifier: Diagnosis of  By: Wynetta Emery RN, Erika     Cholelithiasis    COPD (chronic obstructive pulmonary disease) ([place])    DVT  12/18/2008   Qualifier: Diagnosis of  By: Lamonte Sakai MD, Rose Fillers    DVT (deep venous thrombosis) ([place])    once occurred after trauma,completed coumadin 05/2009   Empyema    GERD (gastroesophageal reflux disease)    Hypertension    Insomnia    Lung abscess ([place]) 2009   METHICILLIN SUSECPTIBLE PNEUMONIA STAPH AUREUS 02/11/2008   Qualifier: Diagnosis of  By: Wynetta Emery RN, Erika     MSSA (methicillin susceptible Staphylococcus aureus) pneumonia ([place])    Pulmonary nodules    Rectal bleeding 05/16/2011    Past Surgical History:  Procedure Laterality Date   BREAST BIOPSY  2014   left breast lumpectomy for "cancer cells"   CESAREAN SECTION  2005   x2   Chest tube placement  2009    Family History  Problem Relation Age of Onset   Heart disease Mother    Diabetes Mother    Colon cancer Father    Throat cancer Father    Breast cancer Sister     Social History   Socioeconomic History   Marital status: Single    Spouse name: Not on file   Number of children: 6   Years of education: Not on file   Highest education level: Not on file  Occupational History   Occupation: homemaker  Tobacco Use   Smoking status: Every Day    Packs/day: 0.50    Years: 26.00    Pack years: 13.00    Types: Cigarettes   Smokeless tobacco: Never   Tobacco comments:    1 ciggs a day. Started at age 61.  Vaping Use   Vaping Use: Never used  Substance and Sexual Activity  Alcohol use: Yes    Comment: mixed drink 1-2 times/month   Drug use: No   Sexual activity: Not Currently    Birth control/protection: None  Other Topics Concern   Not on file  Social History Narrative   Not on file   Social Determinants of Health   Financial Resource Strain: Not on file  Food Insecurity: Not on file  Transportation Needs: Not on file  Physical Activity: Not on file  Stress: Not on file  Social Connections: Not on file  Intimate Partner Violence: Not on file    Outpatient Medications Prior to Visit   Medication Sig Dispense Refill   albuterol (VENTOLIN HFA) 108 (90 Base) MCG/ACT inhaler Inhale 2 puffs into the lungs every 6 (six) hours as needed for wheezing or shortness of breath. 8 g 6   cetirizine (ZYRTEC) 10 MG tablet      clotrimazole (LOTRIMIN) 1 % cream APPLY TO AFFECTED AREA TWICE A DAY 30 g 0   Fluticasone-Salmeterol (ADVAIR DISKUS) 250-50 MCG/DOSE AEPB Inhale 1 puff into the lungs daily. 30 each 5   metoprolol tartrate (LOPRESSOR) 25 MG tablet Take 1.5 tablets (37.5 mg total) by mouth daily. 180 tablet 3   montelukast (SINGULAIR) 10 MG tablet Take 1 tablet (10 mg total) by mouth at bedtime. 30 tablet 11   omeprazole (PRILOSEC) 40 MG capsule Take 40 mg by mouth daily.     OXYGEN Inhale into the lungs. 4 liters at bedtime     theophylline (THEO-24) 400 MG 24 hr capsule Take 400 mg by mouth daily.     tiotropium (SPIRIVA) 18 MCG inhalation capsule Place 1 capsule (18 mcg total) into inhaler and inhale daily. 30 capsule 6   VENTOLIN HFA 108 (90 BASE) MCG/ACT inhaler INHALE 2 PUFFS INTO LUNGS EVERY 4 HOURS AS NEEDED FOR WHEEZING 18 each 3   Vitamin D, Ergocalciferol, (DRISDOL) 1.25 MG (50000 UNIT) CAPS capsule Take 1 capsule (50,000 Units total) by mouth every 7 (seven) days. 8 capsule 0   zolpidem (AMBIEN) 5 MG tablet Take 1 tablet (5 mg total) by mouth at bedtime as needed for sleep. 30 tablet 2   albuterol (PROVENTIL) (2.5 MG/3ML) 0.083% nebulizer solution Take 2.5 mg by nebulization every 6 (six) hours as needed.     azithromycin (ZITHROMAX) 250 MG tablet Take 1 tablet (250 mg total) by mouth 3 (three) times a week. Monday, Wednesday, Friday 12 tablet 11   No facility-administered medications prior to visit.    Allergies  Allergen Reactions   Cefaclor    Penicillins     Review of Systems  Constitutional: Negative.   Respiratory:  Positive for shortness of breath and wheezing.   Cardiovascular: Negative.   Psychiatric/Behavioral: Negative.        Objective:     Physical Exam Constitutional:      Appearance: She is well-developed.     Interventions: She is not intubated. Pulmonary:     Effort: Pulmonary effort is normal. No tachypnea, bradypnea, accessory muscle usage or respiratory distress. She is not intubated.     Breath sounds: Examination of the right-upper field reveals wheezing. Examination of the left-upper field reveals wheezing. Examination of the right-middle field reveals wheezing. Examination of the left-middle field reveals wheezing. Examination of the right-lower field reveals wheezing. Examination of the left-lower field reveals wheezing. Wheezing present. No decreased breath sounds, rhonchi or rales.  Neurological:     Mental Status: She is alert.    There were no vitals taken for  this visit. Wt Readings from Last 3 Encounters:  07/28/20 147 lb (66.7 kg)  07/17/20 150 lb 4 oz (68.2 kg)  07/06/20 152 lb (68.9 kg)    Health Maintenance Due  Topic Date Due   COVID-19 Vaccine (1) Never done   Pneumococcal Vaccine 74-63 Years old (1 - PCV) Never done   TETANUS/TDAP  Never done   PAP SMEAR-Modifier  Never done   COLONOSCOPY (Pts 45-92yr Insurance coverage will need to be confirmed)  Never done   MAMMOGRAM  Never done   Zoster Vaccines- Shingrix (1 of 2) Never done    There are no preventive care reminders to display for this patient.    Lab Results  Component Value Date   WBC 16.1 (H) 07/28/2020   HGB 14.5 07/28/2020   HCT 44.8 07/28/2020   MCV 96.6 07/28/2020   PLT 246 07/28/2020   Lab Results  Component Value Date   NA 135 07/28/2020   K 3.9 07/28/2020   CO2 26 07/28/2020   GLUCOSE 101 (H) 07/28/2020   BUN 8 07/28/2020   CREATININE 0.53 07/28/2020   BILITOT 0.9 07/28/2020   ALKPHOS 80 07/28/2020   AST 14 (L) 07/28/2020   ALT 12 07/28/2020   PROT 6.9 07/28/2020   ALBUMIN 3.3 (L) 07/28/2020   CALCIUM 8.4 (L) 07/28/2020   ANIONGAP 9 07/28/2020   EGFR 106 06/29/2020   Lab Results  Component Value Date    CHOL 156 06/29/2020   Lab Results  Component Value Date   HDL 48 06/29/2020   Lab Results  Component Value Date   LDLCALC 90 06/29/2020   Lab Results  Component Value Date   TRIG 95 06/29/2020   Lab Results  Component Value Date   CHOLHDL 3.6 12/13/2008   Lab Results  Component Value Date   HGBA1C  01/20/2008    5.9 (NOTE)   The ADA recommends the following therapeutic goal for glycemic   control related to Hgb A1C measurement:   Goal of Therapy:   < 7.0% Hgb A1C   Reference: American Diabetes Association: Clinical Practice   Recommendations 2008, Diabetes Care,  2008, 31:(Suppl 1).       Assessment & Plan:   Problem List Items Addressed This Visit       Respiratory   [place]    -was hospitalized with COPD exacerbation, but she left AMA -on exam she had O2 sat 68% on RA, but that increased to 90% with 4 L via nasal canula -she will use her home O2 via [place] during the day and face mask at night -she had anxiety and could not tolerate BiPAP -no increased WOB today, and she feels not much different from her baseline -explained in on unclear terms that I recommend that she go back to the hospital, but she refuses, so I sent in Duonebs  -Rx. z-pack, Duonebs, and prednisone -she agrees to go to hospital if she has increased work of breathing       Relevant Medications   ipratropium-albuterol (DUONEB) 0.5-2.5 (3) MG/3ML SOLN   predniSONE (DELTASONE) 20 MG tablet   azithromycin (ZITHROMAX) 250 MG tablet   ALPRAZolam (XANAX) 0.5 MG tablet   Other Relevant Orders   Home sleep test     Meds ordered this encounter  Medications   ipratropium-albuterol (DUONEB) 0.5-2.5 (3) MG/3ML SOLN    Sig: Take 3 mLs by nebulization every 4 (four) hours as needed.    Dispense:  360  mL    Refill:  1   predniSONE (DELTASONE) 20 MG tablet    Sig: Take 2 tablets (40 mg total) by mouth daily with breakfast for 5 days, THEN 1 tablet  (20 mg total) daily with breakfast for 5 days.    Dispense:  15 tablet    Refill:  0   azithromycin (ZITHROMAX) 250 MG tablet    Sig: Please dispense as a z-pack    Dispense:  6 tablet    Refill:  0   ALPRAZolam (XANAX) 0.5 MG tablet    Sig: Take 1 tablet (0.5 mg total) by mouth 2 (two) times daily as needed for anxiety or sleep.    Dispense:  10 tablet    Refill:  0   Time spent: 40 minutes  Noreene Larsson, NP

## 2020-10-12 NOTE — Assessment & Plan Note (Signed)
-  was hospitalized with COPD exacerbation, but she left AMA -on exam she had O2 sat 68% on RA, but that increased to 90% with 4 L via nasal canula -she will use her home O2 via [place] during the day and face mask at night -she had anxiety and could not tolerate BiPAP -no increased WOB today, and she feels not much different from her baseline -explained in on unclear terms that I recommend that she go back to the hospital, but she refuses, so I sent in Duonebs  -Rx. z-pack, Duonebs, and prednisone -she agrees to go to hospital if she has increased work of breathing

## 2020-10-12 NOTE — Telephone Encounter (Signed)
pt sister is calling because needs o2, pt o2 level is currently at 67% she was at ED & left, was advise to go back by Dr. Sherene Sires possibly states it was [facility]...  wanted to be seen here.. pt sister is also wondering what she need to get order for o2  in [place]-- [phone]

## 2020-10-12 NOTE — Telephone Encounter (Signed)
ATC patient's sister (not listed on DPR), reached a recording that call could not be completed as dialed [phone]). Called and spoke with patient, verified that she went to the ER, was admitted and left the ICU.  She was currently at her PCP office.  Closing encounter, nothing further needed.

## 2020-10-22 ENCOUNTER — Other Ambulatory Visit: Payer: Self-pay

## 2020-10-22 ENCOUNTER — Ambulatory Visit (INDEPENDENT_AMBULATORY_CARE_PROVIDER_SITE_OTHER): Payer: Medicaid Other | Admitting: Pulmonary Disease

## 2020-10-22 ENCOUNTER — Encounter: Payer: Self-pay | Admitting: Pulmonary Disease

## 2020-10-22 ENCOUNTER — Ambulatory Visit: Payer: Medicaid Other | Admitting: Nurse Practitioner

## 2020-10-22 VITALS — BP 130/82 | HR 91 | Ht 65.0 in | Wt 138.0 lb

## 2020-10-22 DIAGNOSIS — J449 Chronic obstructive pulmonary disease, unspecified: Secondary | ICD-10-CM | POA: Diagnosis not present

## 2020-10-22 DIAGNOSIS — F17210 Nicotine dependence, cigarettes, uncomplicated: Secondary | ICD-10-CM

## 2020-10-22 DIAGNOSIS — F17200 Nicotine dependence, unspecified, uncomplicated: Secondary | ICD-10-CM

## 2020-10-22 DIAGNOSIS — R0902 Hypoxemia: Secondary | ICD-10-CM | POA: Diagnosis not present

## 2020-10-22 DIAGNOSIS — J302 Other seasonal allergic rhinitis: Secondary | ICD-10-CM

## 2020-10-22 MED ORDER — BREZTRI AEROSPHERE 160-9-4.8 MCG/ACT IN AERO: 2.0000 | INHALATION_SPRAY | Freq: Two times a day (BID) | RESPIRATORY_TRACT | 0 refills | Status: DC

## 2020-10-22 NOTE — Progress Notes (Signed)
Synopsis: Referred in April 2022 for COPD by Heather Roberts, NP  Subjective:   PATIENT ID: [name] GENDER: female DOB: 1971/02/25, MRN: [id number]  Chief Complaint  Patient presents with   Shortness of Breath    Uses oxygen at night and as needed    PMH of asthma and copd, HTN, DVT (2011 and 2014), currently not on a blood thinner, history of sepsis and empyema. She was admitted to the hospital and was on ventilator for a time being. She moved to [place] and followed with Dr. Orson Aloe in [place]. Prior to that she saw Dr. Delton Coombes. COPD is currently management with spiriva and advair.  Additionally she is taking theophylline.  She does not take this every day.  She is on 400 mg daily.  She also is still smoking a half a pack a day.  She was able to quit smoking when she was 50 years old for a year.  But otherwise has smoked for the past 35 years.  At her max was 2 packs/day and currently down to half a pack a day.  She has not had lung cancer screening.  We talked about this today in the office.   OV 10/22/2020: recently admitted to [facility], admitted on Monday and discharged on last Thursday.  She initially left AMA and had to go back to the hospital.  Treated with antibiotics and steroids.  Overall she is feeling better.  Still maintained on Spiriva and Advair.  She is really trying to quit smoking.  She is using gum as well as nicotine patches.  She is only using her oxygen "as needed".  Unfortunately she is hypoxemic today in the office with O2 sat of 84%.  She does need orders again for her oxygen supply at home.   Past Medical History:  Diagnosis Date   ASTHMA 02/11/2008   Qualifier: Diagnosis of  By: Laural Benes RN, Erika     Cholelithiasis    COPD (chronic obstructive pulmonary disease) (HCC)    DVT 12/18/2008   Qualifier: Diagnosis of  By: Delton Coombes MD, Les Pou    DVT (deep venous thrombosis) (HCC)    once occurred after trauma,completed coumadin 05/2009   Empyema    GERD  (gastroesophageal reflux disease)    Hypertension    Insomnia    Lung abscess (HCC) 2009   METHICILLIN SUSECPTIBLE PNEUMONIA STAPH AUREUS 02/11/2008   Qualifier: Diagnosis of  By: Laural Benes RN, Erika     MSSA (methicillin susceptible Staphylococcus aureus) pneumonia (HCC)    Pulmonary nodules    Rectal bleeding 05/16/2011     Family History  Problem Relation Age of Onset   Heart disease Mother    Diabetes Mother    Colon cancer Father    Throat cancer Father    Breast cancer Sister      Past Surgical History:  Procedure Laterality Date   BREAST BIOPSY  2014   left breast lumpectomy for "cancer cells"   CESAREAN SECTION  2005   x2   Chest tube placement  2009    Social History   Socioeconomic History   Marital status: Single    Spouse name: Not on file   Number of children: 6   Years of education: Not on file   Highest education level: Not on file  Occupational History   Occupation: homemaker  Tobacco Use   Smoking status: Every Day    Packs/day: 0.50    Years: 26.00    Pack years:  13.00    Types: Cigarettes    Start date: [id number]   Smokeless tobacco: Never   Tobacco comments:    1 ciggs a day. Started at age 50.  Vaping Use   Vaping Use: Never used  Substance and Sexual Activity   Alcohol use: Yes    Comment: mixed drink 1-2 times/month   Drug use: No   Sexual activity: Not Currently    Birth control/protection: None  Other Topics Concern   Not on file  Social History Narrative   Not on file   Social Determinants of Health   Financial Resource Strain: Not on file  Food Insecurity: Not on file  Transportation Needs: Not on file  Physical Activity: Not on file  Stress: Not on file  Social Connections: Not on file  Intimate Partner Violence: Not on file     Allergies  Allergen Reactions   Cefaclor    Penicillins      Outpatient Medications Prior to Visit  Medication Sig Dispense Refill   albuterol (VENTOLIN HFA) 108 (90 Base) MCG/ACT inhaler  Inhale 2 puffs into the lungs every 6 (six) hours as needed for wheezing or shortness of breath. 8 g 6   ALPRAZolam (XANAX) 0.5 MG tablet Take 1 tablet (0.5 mg total) by mouth 2 (two) times daily as needed for anxiety or sleep. 10 tablet 0   azithromycin (ZITHROMAX) 250 MG tablet Please dispense as a z-pack 6 tablet 0   cetirizine (ZYRTEC) 10 MG tablet      clotrimazole (LOTRIMIN) 1 % cream APPLY TO AFFECTED AREA TWICE A DAY 30 g 0   Fluticasone-Salmeterol (ADVAIR DISKUS) 250-50 MCG/DOSE AEPB Inhale 1 puff into the lungs daily. 30 each 5   ipratropium-albuterol (DUONEB) 0.5-2.5 (3) MG/3ML SOLN Take 3 mLs by nebulization every 4 (four) hours as needed. 360 mL 1   metoprolol tartrate (LOPRESSOR) 25 MG tablet Take 1.5 tablets (37.5 mg total) by mouth daily. 180 tablet 3   montelukast (SINGULAIR) 10 MG tablet Take 1 tablet (10 mg total) by mouth at bedtime. 30 tablet 11   omeprazole (PRILOSEC) 40 MG capsule Take 40 mg by mouth daily.     OXYGEN Inhale into the lungs. 4 liters at bedtime     predniSONE (DELTASONE) 20 MG tablet Take 2 tablets (40 mg total) by mouth daily with breakfast for 5 days, THEN 1 tablet (20 mg total) daily with breakfast for 5 days. 15 tablet 0   theophylline (THEO-24) 400 MG 24 hr capsule Take 400 mg by mouth daily.     tiotropium (SPIRIVA) 18 MCG inhalation capsule Place 1 capsule (18 mcg total) into inhaler and inhale daily. 30 capsule 6   VENTOLIN HFA 108 (90 BASE) MCG/ACT inhaler INHALE 2 PUFFS INTO LUNGS EVERY 4 HOURS AS NEEDED FOR WHEEZING 18 each 3   Vitamin D, Ergocalciferol, (DRISDOL) 1.25 MG (50000 UNIT) CAPS capsule Take 1 capsule (50,000 Units total) by mouth every 7 (seven) days. 8 capsule 0   zolpidem (AMBIEN) 5 MG tablet Take 1 tablet (5 mg total) by mouth at bedtime as needed for sleep. 30 tablet 2   No facility-administered medications prior to visit.    Review of Systems  Constitutional:  Negative for chills, fever, malaise/fatigue and weight loss.  HENT:   Negative for hearing loss, sore throat and tinnitus.   Eyes:  Negative for blurred vision and double vision.  Respiratory:  Positive for cough and shortness of breath. Negative for hemoptysis, sputum production, wheezing and stridor.  Cardiovascular:  Negative for chest pain, palpitations, orthopnea, leg swelling and PND.  Gastrointestinal:  Negative for abdominal pain, constipation, diarrhea, heartburn, nausea and vomiting.  Genitourinary:  Negative for dysuria, hematuria and urgency.  Musculoskeletal:  Negative for joint pain and myalgias.  Skin:  Negative for itching and rash.  Neurological:  Negative for dizziness, tingling, weakness and headaches.  Endo/Heme/Allergies:  Negative for environmental allergies. Does not bruise/bleed easily.  Psychiatric/Behavioral:  Negative for depression. The patient is not nervous/anxious and does not have insomnia.   All other systems reviewed and are negative.   Objective:  Physical Exam Vitals reviewed.  Constitutional:      General: She is not in acute distress.    Appearance: She is well-developed.  HENT:     Head: Normocephalic and atraumatic.     Mouth/Throat:     Pharynx: No oropharyngeal exudate.  Eyes:     Conjunctiva/sclera: Conjunctivae normal.     Pupils: Pupils are equal, round, and reactive to light.  Neck:     Vascular: No JVD.     Trachea: No tracheal deviation.  Cardiovascular:     Rate and Rhythm: Normal rate and regular rhythm.     Heart sounds: S1 normal and S2 normal.     Comments: Distant heart tones Pulmonary:     Effort: No tachypnea or accessory muscle usage.     Breath sounds: No stridor. Decreased breath sounds (throughout all lung fields) and wheezing present. No rhonchi or rales.  Abdominal:     General: Bowel sounds are normal. There is no distension.     Palpations: Abdomen is soft.     Tenderness: There is no abdominal tenderness.  Musculoskeletal:        General: Deformity (muscle wasting ) present.   Skin:    General: Skin is warm and dry.     Capillary Refill: Capillary refill takes less than 2 seconds.     Findings: No rash.  Neurological:     Mental Status: She is alert and oriented to person, place, and time.  Psychiatric:        Behavior: Behavior normal.     Vitals:   10/22/20 1130  BP: 130/82  Pulse: 91  SpO2: (!) 84%  Weight: 138 lb (62.6 kg)  Height: 5\' 5"  (1.651 m)   (!) 84% on RA BMI Readings from Last 3 Encounters:  10/22/20 22.96 kg/m  10/12/20 24.46 kg/m  07/28/20 24.46 kg/m   Wt Readings from Last 3 Encounters:  10/22/20 138 lb (62.6 kg)  10/12/20 147 lb (66.7 kg)  07/28/20 147 lb (66.7 kg)    CBC    Component Value Date/Time   WBC 16.1 (H) 07/28/2020 1143   RBC 4.64 07/28/2020 1143   HGB 14.5 07/28/2020 1143   HGB 16.7 (H) 06/29/2020 0959   HCT 44.8 07/28/2020 1143   HCT 51.6 (H) 06/29/2020 0959   PLT 246 07/28/2020 1143   PLT 260 06/29/2020 0959   MCV 96.6 07/28/2020 1143   MCV 94 06/29/2020 0959   MCH 31.3 07/28/2020 1143   MCHC 32.4 07/28/2020 1143   RDW 15.6 (H) 07/28/2020 1143   RDW 14.5 06/29/2020 0959   LYMPHSABS 1.7 07/28/2020 1143   LYMPHSABS 1.7 06/29/2020 0959   MONOABS 1.2 (H) 07/28/2020 1143   EOSABS 0.2 07/28/2020 1143   EOSABS 0.2 06/29/2020 0959   BASOSABS 0.1 07/28/2020 1143   BASOSABS 0.0 06/29/2020 0959    Chest Imaging: No recent CXR   Pulmonary Functions Testing  Results: No flowsheet data found.  FeNO:   Pathology:   Echocardiogram:   Heart Catheterization:     Assessment & Plan:     ICD-10-CM   1. Chronic obstructive pulmonary disease, unspecified COPD type (HCC)  J44.9 Ambulatory Referral for DME    2. Moderate cigarette smoker (10-19 per day)  F17.210     3. Current smoker  F17.200     4. Seasonal allergies  J30.2     5. Hypoxemia  R09.02        Discussion:  50 year old female, current smoker labeled with COPD, no PFTs on file.  They were ordered last office visit but she is yet  to have that done.  She does have seasonal allergies on antihistamine.  Currently on triple therapy inhaler to include Spiriva plus Advair.  Plan: Samples given today of Breztri. She was given samples of Trelegy last time but thought it caused her to have a headache so she stopped using it. She went back to her previous medications. Continue Singulair Continue albuterol as needed for shortness of breath and wheezing Continue smoking cessation. She is working on trying to taper off of this. PFTs prior to next office visit She has a lung cancer screening CT already scheduled for next month.  2 L nasal cannula O2 continuous DME supply orders for 2 L nasal cannula during the day, with exertion and at night New orders for POC as well as new oxygen concentrator    Current Outpatient Medications:    albuterol (VENTOLIN HFA) 108 (90 Base) MCG/ACT inhaler, Inhale 2 puffs into the lungs every 6 (six) hours as needed for wheezing or shortness of breath., Disp: 8 g, Rfl: 6   ALPRAZolam (XANAX) 0.5 MG tablet, Take 1 tablet (0.5 mg total) by mouth 2 (two) times daily as needed for anxiety or sleep., Disp: 10 tablet, Rfl: 0   azithromycin (ZITHROMAX) 250 MG tablet, Please dispense as a z-pack, Disp: 6 tablet, Rfl: 0   cetirizine (ZYRTEC) 10 MG tablet, , Disp: , Rfl:    clotrimazole (LOTRIMIN) 1 % cream, APPLY TO AFFECTED AREA TWICE A DAY, Disp: 30 g, Rfl: 0   Fluticasone-Salmeterol (ADVAIR DISKUS) 250-50 MCG/DOSE AEPB, Inhale 1 puff into the lungs daily., Disp: 30 each, Rfl: 5   ipratropium-albuterol (DUONEB) 0.5-2.5 (3) MG/3ML SOLN, Take 3 mLs by nebulization every 4 (four) hours as needed., Disp: 360 mL, Rfl: 1   metoprolol tartrate (LOPRESSOR) 25 MG tablet, Take 1.5 tablets (37.5 mg total) by mouth daily., Disp: 180 tablet, Rfl: 3   montelukast (SINGULAIR) 10 MG tablet, Take 1 tablet (10 mg total) by mouth at bedtime., Disp: 30 tablet, Rfl: 11   omeprazole (PRILOSEC) 40 MG capsule, Take 40 mg by  mouth daily., Disp: , Rfl:    OXYGEN, Inhale into the lungs. 4 liters at bedtime, Disp: , Rfl:    predniSONE (DELTASONE) 20 MG tablet, Take 2 tablets (40 mg total) by mouth daily with breakfast for 5 days, THEN 1 tablet (20 mg total) daily with breakfast for 5 days., Disp: 15 tablet, Rfl: 0   theophylline (THEO-24) 400 MG 24 hr capsule, Take 400 mg by mouth daily., Disp: , Rfl:    tiotropium (SPIRIVA) 18 MCG inhalation capsule, Place 1 capsule (18 mcg total) into inhaler and inhale daily., Disp: 30 capsule, Rfl: 6   VENTOLIN HFA 108 (90 BASE) MCG/ACT inhaler, INHALE 2 PUFFS INTO LUNGS EVERY 4 HOURS AS NEEDED FOR WHEEZING, Disp: 18 each, Rfl: 3   Vitamin  D, Ergocalciferol, (DRISDOL) 1.25 MG (50000 UNIT) CAPS capsule, Take 1 capsule (50,000 Units total) by mouth every 7 (seven) days., Disp: 8 capsule, Rfl: 0   zolpidem (AMBIEN) 5 MG tablet, Take 1 tablet (5 mg total) by mouth at bedtime as needed for sleep., Disp: 30 tablet, Rfl: 2   Josephine Igo, DO [facility] 10/22/2020 11:57 AM

## 2020-10-22 NOTE — Patient Instructions (Signed)
Thank you for visiting Dr. Tonia Brooms at [facility]. Today we recommend the following:  POC evaluation  New oxygen concentrator  Samples of breztri + spacer New prescription for breztri   Return in about 6 months (around 04/24/2021) for Dr. Tonia Brooms .    Please do your part to reduce the spread of COVID-19.

## 2020-10-26 ENCOUNTER — Telehealth: Payer: Self-pay | Admitting: Pulmonary Disease

## 2020-10-26 NOTE — Telephone Encounter (Signed)
Called and spoke with patient to let her know the name of her company for her oxygen that we sent order to was [employer]. She expressed understanding and stated she thinks they are coming on Wednesday. Informed her I don't have access to that information. She expressed understanding. Nothing further needed at this time.

## 2020-10-26 NOTE — Telephone Encounter (Signed)
Pt is calling to see in regards to who is coming to her home tomorrow for her O2 and POC stated she is unsure. Pls regard; [phone]

## 2020-11-06 ENCOUNTER — Ambulatory Visit (HOSPITAL_COMMUNITY): Admission: RE | Admit: 2020-11-06 | Payer: Medicaid Other | Source: Ambulatory Visit

## 2020-11-06 ENCOUNTER — Encounter: Payer: Medicaid Other | Admitting: Primary Care

## 2020-11-06 ENCOUNTER — Other Ambulatory Visit: Payer: Self-pay

## 2020-11-06 NOTE — Progress Notes (Signed)
 Err

## 2020-11-16 ENCOUNTER — Telehealth: Payer: Self-pay | Admitting: Acute Care

## 2020-11-16 NOTE — Telephone Encounter (Signed)
Spoke to pt and rescheduled shared decision visit  for 12/28/20 9:00. CT will be rescheduled. Nothing further needed.

## 2020-12-28 ENCOUNTER — Ambulatory Visit (INDEPENDENT_AMBULATORY_CARE_PROVIDER_SITE_OTHER): Payer: Medicaid Other | Admitting: Acute Care

## 2020-12-28 ENCOUNTER — Ambulatory Visit (HOSPITAL_COMMUNITY)
Admission: RE | Admit: 2020-12-28 | Discharge: 2020-12-28 | Disposition: A | Discharge status: Home / Self Care | Payer: Medicaid Other | Source: Ambulatory Visit | Attending: Acute Care | Admitting: Acute Care

## 2020-12-28 ENCOUNTER — Other Ambulatory Visit: Payer: Self-pay

## 2020-12-28 ENCOUNTER — Encounter: Payer: Self-pay | Admitting: Acute Care

## 2020-12-28 DIAGNOSIS — F17210 Nicotine dependence, cigarettes, uncomplicated: Secondary | ICD-10-CM | POA: Insufficient documentation

## 2020-12-28 DIAGNOSIS — Z720 Tobacco use: Secondary | ICD-10-CM

## 2020-12-28 DIAGNOSIS — Z87891 Personal history of nicotine dependence: Secondary | ICD-10-CM | POA: Insufficient documentation

## 2020-12-28 NOTE — Progress Notes (Signed)
Virtual Visit via Telephone Note  I connected with Darcel Smalling on 12/28/20 at  9:00 AM EDT by telephone and verified that I am speaking with the correct person using two identifiers.  Location: Patient: At home Provider: [address], [place], [place], [address]    I discussed the limitations, risks, security and privacy concerns of performing an evaluation and management service by telephone and the availability of in person appointments. I also discussed with the patient that there may be a patient responsible charge related to this service. The patient expressed understanding and agreed to proceed.   Shared Decision Making Visit Lung Cancer Screening Program [phone])   Eligibility: Age 50 y.o. Pack Years Smoking History Calculation 26 pack year smoking history (# packs/per year x # years smoked) Recent History of coughing up blood  no Unexplained weight loss? no ( >Than 15 pounds within the last 6 months ) Prior History Lung / other cancer no (Diagnosis within the last 5 years already requiring surveillance chest CT Scans). Smoking Status Current Smoker Former Smokers: Years since quit: NA  Quit Date: NA  Visit Components: Discussion included one or more decision making aids. yes Discussion included risk/benefits of screening. yes Discussion included potential follow up diagnostic testing for abnormal scans. yes Discussion included meaning and risk of over diagnosis. yes Discussion included meaning and risk of False Positives. yes Discussion included meaning of total radiation exposure. yes  Counseling Included: Importance of adherence to annual lung cancer LDCT screening. yes Impact of comorbidities on ability to participate in the program. yes Ability and willingness to under diagnostic treatment. yes  Smoking Cessation Counseling: Current Smokers:  Discussed importance of smoking cessation. yes Information about tobacco cessation classes and interventions  provided to patient. yes Patient provided with "ticket" for LDCT Scan. yes Symptomatic Patient. no  Counseling Diagnosis Code: Tobacco Use Z72.0 Asymptomatic Patient yes  Counseling (Intermediate counseling: > three minutes counseling) I7782 Former Smokers:  Discussed the importance of maintaining cigarette abstinence. yes Diagnosis Code: Personal History of Nicotine Dependence. U23.536 Information about tobacco cessation classes and interventions provided to patient. Yes Patient provided with "ticket" for LDCT Scan. yes Written Order for Lung Cancer Screening with LDCT placed in Epic. Yes (CT Chest Lung Cancer Screening Low Dose W/O CM) [id number] Z12.2-Screening of respiratory organs Z87.891-Personal history of nicotine dependence  I have spent 25 minutes of face to face time with Ms. Rode discussing the risks and benefits of lung cancer screening. We viewed a power point together that explained in detail the above noted topics. We paused at intervals to allow for questions to be asked and answered to ensure understanding.We discussed that the single most powerful action that she can take to decrease her risk of developing lung cancer is to quit smoking. We discussed whether or not she is ready to commit to setting a quit date. We discussed options for tools to aid in quitting smoking including nicotine replacement therapy, non-nicotine medications, support groups, Quit Smart classes, and behavior modification. We discussed that often times setting smaller, more achievable goals, such as eliminating 1 cigarette a day for a week and then 2 cigarettes a day for a week can be helpful in slowly decreasing the number of cigarettes smoked. This allows for a sense of accomplishment as well as providing a clinical benefit. I gave her the " Be Stronger Than Your Excuses" card with contact information for community resources, classes, free nicotine replacement therapy, and access to mobile apps, text  messaging, and on-line smoking cessation help. I have also given her my card and contact information in the event she needs to contact me. We discussed the time and location of the scan, and that either Abigail Miyamoto RN or I will call with the results within 24-48 hours of receiving them. I have offered her  a copy of the power point we viewed  as a resource in the event they need reinforcement of the concepts we discussed today in the office. The patient verbalized understanding of all of  the above and had no further questions upon leaving the office. They have my contact information in the event they have any further questions.  I spent 3 minutes counseling on smoking cessation and the health risks of continued tobacco abuse.  I explained to the patient that there has been a high incidence of coronary artery disease noted on these exams. I explained that this is a non-gated exam therefore degree or severity cannot be determined. This patient is not on statin therapy. I have asked the patient to follow-up with their PCP regarding any incidental finding of coronary artery disease and management with diet or medication as their PCP  feels is clinically indicated. The patient verbalized understanding of the above and had no further questions upon completion of the visit.      Bevelyn Ngo, NP 12/28/2020

## 2020-12-28 NOTE — Patient Instructions (Signed)
Thank you for participating in the [facility]. It was our pleasure to meet you today. We will call you with the results of your scan within the next few days. Your scan will be assigned a Lung RADS category score by the physicians reading the scans.  This Lung RADS score determines follow up scanning.  See below for description of categories, and follow up screening recommendations. We will be in touch to schedule your follow up screening annually or based on recommendations of our providers. We will fax a copy of your scan results to your Primary Care Physician, or the physician who referred you to the program, to ensure they have the results. Please call the office if you have any questions or concerns regarding your scanning experience or results.  Our office number is [phone]. Please speak with Denise Phelps, RN. She is our Lung Cancer Screening RN. If she is unavailable when you call, please have the office staff send her a message. She will return your call at her earliest convenience. Remember, if your scan is normal, we will scan you annually as long as you continue to meet the criteria for the program. (Age 55-77, Current smoker or smoker who has quit within the last 15 years). If you are a smoker, remember, quitting is the single most powerful action that you can take to decrease your risk of lung cancer and other pulmonary, breathing related problems. We know quitting is hard, and we are here to help.  Please let us know if there is anything we can do to help you meet your goal of quitting. If you are a former smoker, congratulations. We are proud of you! Remain smoke free! Remember you can refer friends or family members through the number above.  We will screen them to make sure they meet criteria for the program. Thank you for helping us take better care of you by participating in Lung Screening.  Lung RADS Categories:  Lung RADS 1: no nodules  or definitely non-concerning nodules.  Recommendation is for a repeat annual scan in 12 months.  Lung RADS 2:  nodules that are non-concerning in appearance and behavior with a very low likelihood of becoming an active cancer. Recommendation is for a repeat annual scan in 12 months.  Lung RADS 3: nodules that are probably non-concerning , includes nodules with a low likelihood of becoming an active cancer.  Recommendation is for a 6-month repeat screening scan. Often noted after an upper respiratory illness. We will be in touch to make sure you have no questions, and to schedule your 6-month scan.  Lung RADS 4 A: nodules with concerning findings, recommendation is most often for a follow up scan in 3 months or additional testing based on our provider's assessment of the scan. We will be in touch to make sure you have no questions and to schedule the recommended 3 month follow up scan.  Lung RADS 4 B:  indicates findings that are concerning. We will be in touch with you to schedule additional diagnostic testing based on our provider's  assessment of the scan.   

## 2021-01-01 ENCOUNTER — Telehealth: Payer: Self-pay | Admitting: Pulmonary Disease

## 2021-01-01 DIAGNOSIS — F17210 Nicotine dependence, cigarettes, uncomplicated: Secondary | ICD-10-CM

## 2021-01-01 DIAGNOSIS — Z87891 Personal history of nicotine dependence: Secondary | ICD-10-CM

## 2021-01-01 NOTE — Telephone Encounter (Signed)
Called and spoke with patient who is calling because she had lung cancer screening on Monday. States she was told it would be two days for results but hasn't heard anything.    Maralyn Sago or Angelique Blonder Please advise.

## 2021-01-04 ENCOUNTER — Ambulatory Visit: Payer: Medicaid Other | Admitting: Nurse Practitioner

## 2021-01-04 NOTE — Telephone Encounter (Signed)
[name], can you take a look at these results before I call the pt? There was an area of mucoid impaction noted on scan.  Let me know and I will call pt.

## 2021-01-04 NOTE — Telephone Encounter (Signed)
Spoke with pt and reviewed CT results per Kandice Robinsons, NP. Scheduled pt for video visit 01/06/21 3:00. Pt verbalized understanding.

## 2021-01-06 ENCOUNTER — Telehealth (INDEPENDENT_AMBULATORY_CARE_PROVIDER_SITE_OTHER): Payer: Medicaid Other | Admitting: Acute Care

## 2021-01-06 ENCOUNTER — Other Ambulatory Visit: Payer: Self-pay

## 2021-01-06 ENCOUNTER — Encounter: Payer: Self-pay | Admitting: Acute Care

## 2021-01-06 DIAGNOSIS — I517 Cardiomegaly: Secondary | ICD-10-CM | POA: Diagnosis not present

## 2021-01-06 DIAGNOSIS — K766 Portal hypertension: Secondary | ICD-10-CM

## 2021-01-06 DIAGNOSIS — I2721 Secondary pulmonary arterial hypertension: Secondary | ICD-10-CM

## 2021-01-06 DIAGNOSIS — Z720 Tobacco use: Secondary | ICD-10-CM | POA: Diagnosis not present

## 2021-01-06 DIAGNOSIS — T17500A Unspecified foreign body in bronchus causing asphyxiation, initial encounter: Secondary | ICD-10-CM

## 2021-01-06 NOTE — Patient Instructions (Addendum)
It was good to talk with you today. Start Mucinex 1200 mg daily with a full glass of water Use flutter valve daily to help with chest congestion Follow-up with GI and cardiology as you have scheduled Follow-up televisit in 1 month with Maralyn Sago NP to evaluate how you are doing Please work on quitting smoking completely

## 2021-01-06 NOTE — Progress Notes (Signed)
Virtual Visit via Telephone Note  Visit was converted from a video visit as patient 's phone camera is broken.   I connected with Darcel Smalling on 01/06/21 at  3:00 PM EDT by telephone and verified that I am speaking with the correct person using two identifiers.  Location: Patient: at home Provider: [address] [place], [place], [address]    I discussed the limitations, risks, security and privacy concerns of performing an evaluation and management service by telephone and the availability of in person appointments. I also discussed with the patient that there may be a patient responsible charge related to this service. The patient expressed understanding and agreed to proceed.   History of Present Illness: [name] is a 50 y.o. female current every day smoker with asthma, COPD, HTN, DVT (2011 and 2014).She is followed though the lung cancer screening program. She is here for follow up of a scan concerning for mucoid impaction,  pulmonary hypertension and enlarged heart.  She has been followed by Dr. Tonia Brooms and Dr. Delton Coombes in the past..  Maintenance Inhalers : Spiriva and Advair   01/06/2021 I called the patient to discuss the finding of mucoid impaction on her LDCT. She states she has been unable to eat due to an issue with her swallow .  She states that she has had significant weight loss due to this issue.  She states she feels like there is a blockage in her upper chest.  She cannot eat solid foods.  This is consistent with the finding of mucoid impaction concerning for aspiration.  Upon further questioning she stated that her mother has had issues with the same thing in the past.  Her mother had to have her esophagus dilated.  Patient currently has scheduled appointments with GI, and with cardiology to evaluate the cardiomegaly and questionable pulmonary hypertension.  Both physicians are more local to her in [place].  I have encouraged her to follow through with these  appointments as I feel resolution of her swallowing issues will in turn resolve the findings of mucoid impaction in her lungs. Cardiology appointment is with Dr. Armanda Magic in [place]  GI appointment is scheduled for 01/20/2021 in [facility]. patient is unsure of the physician's name    Observations/Objective: Test Results: Lung-RADS 1, negative. Continue annual screening with low-dose chest CT without contrast in 12 months. 2. New peribronchovascular nodularity and mucoid impaction, predominantly in the right lower lobe, can be seen with aspiration. 3. Coronary artery calcification. 4. Enlarged pulmonic trunk, indicative of pulmonary arterial hypertension. 5.  Emphysema (ICD10-J43.9).  Assessment and Plan: Mucoid Impaction  ? Aspiration Cardiomegally and ? PAH Tobacco Abuse Start Mucinex 1200 mg daily with a full glass of water Use flutter valve daily to help with chest congestion Follow-up with GI and cardiology as you have scheduled Follow-up televisit in 1 month with Maralyn Sago NP to evaluate how you are doing Please work on quitting smoking completely.  Follow Up Instructions: Follow-up with GI and cardiology as you have scheduled Follow-up televisit in 1 month with Maralyn Sago NP to evaluate how you are doing    I discussed the assessment and treatment plan with the patient. The patient was provided an opportunity to ask questions and all were answered. The patient agreed with the plan and demonstrated an understanding of the instructions.   The patient was advised to call back or seek an in-person evaluation if the symptoms worsen or if the condition fails to improve as  anticipated.  I provided 30 minutes of non-face-to-face time during this encounter.   Bevelyn Ngo, NP  01/06/2021

## 2021-01-25 ENCOUNTER — Telehealth: Payer: Self-pay

## 2021-01-25 NOTE — Telephone Encounter (Signed)
She has not been referred from this office to GI since 2013 she may need to call her social worker to see which GI doctor excepts her insurance

## 2021-01-25 NOTE — Telephone Encounter (Addendum)
Called patient because she has a flutter valve upfront to be picked up and she stated that she went to go to the GI doctor today that we referred her to and they do not accept her [place] Medicaid. She states that she will need to go somewhere that accepts her [place] medicaid. Will route to [facility]  Patient does not want the flutter valve. I will put it back

## 2021-01-25 NOTE — Telephone Encounter (Signed)
I have called and LM On VM

## 2021-01-27 NOTE — Telephone Encounter (Signed)
I have called the pt and she is aware of recs.  She will contact her PCP to see if they are they ones that referred her to GI.

## 2021-03-03 ENCOUNTER — Ambulatory Visit: Payer: Medicaid Other | Admitting: Nurse Practitioner

## 2021-04-06 ENCOUNTER — Ambulatory Visit: Payer: Medicaid Other | Admitting: Nurse Practitioner

## 2021-04-27 ENCOUNTER — Telehealth: Payer: Self-pay | Admitting: Pulmonary Disease

## 2021-04-27 ENCOUNTER — Encounter (HOSPITAL_COMMUNITY): Payer: Self-pay | Admitting: *Deleted

## 2021-04-27 ENCOUNTER — Emergency Department (HOSPITAL_COMMUNITY): Payer: Medicaid Other

## 2021-04-27 ENCOUNTER — Inpatient Hospital Stay (HOSPITAL_COMMUNITY)
Admission: EM | Admit: 2021-04-27 | Discharge: 2021-04-29 | DRG: 189 | Disposition: A | Discharge status: Home / Self Care | Payer: Medicaid Other | Attending: Internal Medicine | Admitting: Internal Medicine

## 2021-04-27 ENCOUNTER — Other Ambulatory Visit: Payer: Self-pay

## 2021-04-27 DIAGNOSIS — Z800 Family history of malignant neoplasm of digestive organs: Secondary | ICD-10-CM | POA: Diagnosis not present

## 2021-04-27 DIAGNOSIS — J9622 Acute and chronic respiratory failure with hypercapnia: Principal | ICD-10-CM | POA: Diagnosis present

## 2021-04-27 DIAGNOSIS — Z8249 Family history of ischemic heart disease and other diseases of the circulatory system: Secondary | ICD-10-CM | POA: Diagnosis not present

## 2021-04-27 DIAGNOSIS — R042 Hemoptysis: Secondary | ICD-10-CM | POA: Diagnosis present

## 2021-04-27 DIAGNOSIS — Z79899 Other long term (current) drug therapy: Secondary | ICD-10-CM

## 2021-04-27 DIAGNOSIS — Z7951 Long term (current) use of inhaled steroids: Secondary | ICD-10-CM | POA: Diagnosis not present

## 2021-04-27 DIAGNOSIS — Z833 Family history of diabetes mellitus: Secondary | ICD-10-CM | POA: Diagnosis not present

## 2021-04-27 DIAGNOSIS — J449 Chronic obstructive pulmonary disease, unspecified: Secondary | ICD-10-CM | POA: Diagnosis present

## 2021-04-27 DIAGNOSIS — Z86718 Personal history of other venous thrombosis and embolism: Secondary | ICD-10-CM | POA: Diagnosis not present

## 2021-04-27 DIAGNOSIS — E440 Moderate protein-calorie malnutrition: Secondary | ICD-10-CM | POA: Insufficient documentation

## 2021-04-27 DIAGNOSIS — Z808 Family history of malignant neoplasm of other organs or systems: Secondary | ICD-10-CM | POA: Diagnosis not present

## 2021-04-27 DIAGNOSIS — I10 Essential (primary) hypertension: Secondary | ICD-10-CM | POA: Diagnosis present

## 2021-04-27 DIAGNOSIS — F419 Anxiety disorder, unspecified: Secondary | ICD-10-CM | POA: Diagnosis present

## 2021-04-27 DIAGNOSIS — Z6823 Body mass index (BMI) 23.0-23.9, adult: Secondary | ICD-10-CM

## 2021-04-27 DIAGNOSIS — K219 Gastro-esophageal reflux disease without esophagitis: Secondary | ICD-10-CM | POA: Diagnosis present

## 2021-04-27 DIAGNOSIS — Z23 Encounter for immunization: Secondary | ICD-10-CM

## 2021-04-27 DIAGNOSIS — E559 Vitamin D deficiency, unspecified: Secondary | ICD-10-CM | POA: Diagnosis present

## 2021-04-27 DIAGNOSIS — K5900 Constipation, unspecified: Secondary | ICD-10-CM | POA: Diagnosis present

## 2021-04-27 DIAGNOSIS — F17210 Nicotine dependence, cigarettes, uncomplicated: Secondary | ICD-10-CM | POA: Diagnosis present

## 2021-04-27 DIAGNOSIS — F17200 Nicotine dependence, unspecified, uncomplicated: Secondary | ICD-10-CM | POA: Diagnosis present

## 2021-04-27 DIAGNOSIS — Z803 Family history of malignant neoplasm of breast: Secondary | ICD-10-CM | POA: Diagnosis not present

## 2021-04-27 DIAGNOSIS — Z9981 Dependence on supplemental oxygen: Secondary | ICD-10-CM | POA: Diagnosis not present

## 2021-04-27 DIAGNOSIS — J441 Chronic obstructive pulmonary disease with (acute) exacerbation: Secondary | ICD-10-CM | POA: Diagnosis not present

## 2021-04-27 DIAGNOSIS — Z20822 Contact with and (suspected) exposure to covid-19: Secondary | ICD-10-CM | POA: Diagnosis present

## 2021-04-27 DIAGNOSIS — J9621 Acute and chronic respiratory failure with hypoxia: Secondary | ICD-10-CM | POA: Diagnosis present

## 2021-04-27 LAB — BLOOD GAS, VENOUS
Acid-Base Excess: 13.9 mmol/L — ABNORMAL HIGH (ref 0.0–2.0)
Bicarbonate: 33.1 mmol/L — ABNORMAL HIGH (ref 20.0–28.0)
FIO2: 28
O2 Saturation: 51 %
Patient temperature: 37.1
pCO2, Ven: 83.3 mmHg (ref 44.0–60.0)
pH, Ven: 7.309 (ref 7.250–7.430)
pO2, Ven: <31 mmHg — CL (ref 32.0–45.0)

## 2021-04-27 LAB — BASIC METABOLIC PANEL
Anion gap: 5 (ref 5–15)
BUN: 9 mg/dL (ref 6–20)
CO2: 38 mmol/L — ABNORMAL HIGH (ref 22–32)
Calcium: 8.8 mg/dL — ABNORMAL LOW (ref 8.9–10.3)
Chloride: 99 mmol/L (ref 98–111)
Creatinine, Ser: 0.43 mg/dL — ABNORMAL LOW (ref 0.44–1.00)
GFR, Estimated: >60 mL/min (ref >60)
Glucose, Bld: 112 mg/dL — ABNORMAL HIGH (ref 70–99)
Potassium: 3.7 mmol/L (ref 3.5–5.1)
Sodium: 142 mmol/L (ref 135–145)

## 2021-04-27 LAB — CBC
HCT: 48.4 % — ABNORMAL HIGH (ref 36.0–46.0)
Hemoglobin: 15.1 g/dL — ABNORMAL HIGH (ref 12.0–15.0)
MCH: 32.9 pg (ref 26.0–34.0)
MCHC: 31.2 g/dL (ref 30.0–36.0)
MCV: 105.4 fL — ABNORMAL HIGH (ref 80.0–100.0)
Platelets: 185 10*3/uL (ref 150–400)
RBC: 4.59 MIL/uL (ref 3.87–5.11)
RDW: 15.3 % (ref 11.5–15.5)
WBC: 7.9 10*3/uL (ref 4.0–10.5)
nRBC: 0 % (ref 0.0–0.2)

## 2021-04-27 LAB — TROPONIN I (HIGH SENSITIVITY)
Troponin I (High Sensitivity): 5 ng/L (ref <18)
Troponin I (High Sensitivity): 6 ng/L (ref <18)

## 2021-04-27 LAB — RESP PANEL BY RT-PCR (FLU A&B, COVID) ARPGX2
Influenza A by PCR: NEGATIVE
Influenza B by PCR: NEGATIVE
SARS Coronavirus 2 by RT PCR: NEGATIVE

## 2021-04-27 LAB — HIV ANTIBODY (ROUTINE TESTING W REFLEX): HIV Screen 4th Generation wRfx: NONREACTIVE

## 2021-04-27 LAB — MRSA NEXT GEN BY PCR, NASAL: MRSA by PCR Next Gen: NOT DETECTED

## 2021-04-27 LAB — MAGNESIUM: Magnesium: 2 mg/dL (ref 1.7–2.4)

## 2021-04-27 LAB — EXPECTORATED SPUTUM ASSESSMENT W GRAM STAIN, RFLX TO RESP C

## 2021-04-27 LAB — POC URINE PREG, ED: Preg Test, Ur: NEGATIVE

## 2021-04-27 MED ORDER — ACETAMINOPHEN 650 MG RE SUPP: 650.0000 mg | Freq: Four times a day (QID) | RECTAL | Status: DC | PRN

## 2021-04-27 MED ORDER — TRAZODONE HCL 50 MG PO TABS
25.0000 mg | ORAL_TABLET | Freq: Every evening | ORAL | Status: DC | PRN
  Administered 2021-04-27 – 2021-04-28 (×2): 25 mg via ORAL
  Filled 2021-04-27 (×2): qty 1

## 2021-04-27 MED ORDER — ENSURE ENLIVE PO LIQD
237.0000 mL | Freq: Two times a day (BID) | ORAL | Status: DC
  Administered 2021-04-27 – 2021-04-29 (×4): 237 mL via ORAL

## 2021-04-27 MED ORDER — PANTOPRAZOLE SODIUM 40 MG PO TBEC
40.0000 mg | DELAYED_RELEASE_TABLET | Freq: Every day | ORAL | Status: DC
  Administered 2021-04-28 – 2021-04-29 (×2): 40 mg via ORAL
  Filled 2021-04-27 (×2): qty 1

## 2021-04-27 MED ORDER — MONTELUKAST SODIUM 10 MG PO TABS
10.0000 mg | ORAL_TABLET | Freq: Every day | ORAL | Status: DC
  Administered 2021-04-27 – 2021-04-28 (×2): 10 mg via ORAL
  Filled 2021-04-27 (×2): qty 1

## 2021-04-27 MED ORDER — INFLUENZA VAC SPLIT QUAD 0.5 ML IM SUSY
0.5000 mL | PREFILLED_SYRINGE | INTRAMUSCULAR | Status: AC
  Administered 2021-04-28: 0.5 mL via INTRAMUSCULAR
  Filled 2021-04-27: qty 0.5

## 2021-04-27 MED ORDER — ONDANSETRON HCL 4 MG/2ML IJ SOLN: 4.0000 mg | Freq: Four times a day (QID) | INTRAMUSCULAR | Status: DC | PRN

## 2021-04-27 MED ORDER — METHYLPREDNISOLONE SODIUM SUCC 125 MG IJ SOLR
60.0000 mg | Freq: Two times a day (BID) | INTRAMUSCULAR | Status: DC
  Administered 2021-04-28: 60 mg via INTRAVENOUS
  Filled 2021-04-27: qty 2

## 2021-04-27 MED ORDER — METHYLPREDNISOLONE SODIUM SUCC 125 MG IJ SOLR
125.0000 mg | Freq: Once | INTRAMUSCULAR | Status: AC
  Administered 2021-04-27: 125 mg via INTRAVENOUS
  Filled 2021-04-27: qty 2

## 2021-04-27 MED ORDER — CHLORHEXIDINE GLUCONATE CLOTH 2 % EX PADS
6.0000 | MEDICATED_PAD | Freq: Every day | CUTANEOUS | Status: DC
  Administered 2021-04-28: 6 via TOPICAL

## 2021-04-27 MED ORDER — BUDESONIDE 0.25 MG/2ML IN SUSP
0.2500 mg | Freq: Two times a day (BID) | RESPIRATORY_TRACT | Status: DC
  Administered 2021-04-27: 0.25 mg via RESPIRATORY_TRACT
  Filled 2021-04-27: qty 2

## 2021-04-27 MED ORDER — METOPROLOL SUCCINATE ER 25 MG PO TB24
25.0000 mg | ORAL_TABLET | Freq: Two times a day (BID) | ORAL | Status: DC
  Administered 2021-04-27 – 2021-04-29 (×4): 25 mg via ORAL
  Filled 2021-04-27 (×3): qty 1

## 2021-04-27 MED ORDER — IPRATROPIUM-ALBUTEROL 0.5-2.5 (3) MG/3ML IN SOLN
9.0000 mL | Freq: Once | RESPIRATORY_TRACT | Status: AC
  Administered 2021-04-27: 9 mL via RESPIRATORY_TRACT
  Filled 2021-04-27: qty 15

## 2021-04-27 MED ORDER — IOHEXOL 350 MG/ML SOLN
100.0000 mL | Freq: Once | INTRAVENOUS | Status: AC | PRN
  Administered 2021-04-27: 100 mL via INTRAVENOUS

## 2021-04-27 MED ORDER — ONDANSETRON HCL 4 MG PO TABS: 4.0000 mg | ORAL_TABLET | Freq: Four times a day (QID) | ORAL | Status: DC | PRN

## 2021-04-27 MED ORDER — REVEFENACIN 175 MCG/3ML IN SOLN
175.0000 ug | Freq: Every day | RESPIRATORY_TRACT | Status: DC
  Administered 2021-04-28 – 2021-04-29 (×2): 175 ug via RESPIRATORY_TRACT
  Filled 2021-04-27 (×2): qty 3

## 2021-04-27 MED ORDER — ALPRAZOLAM 0.5 MG PO TABS
0.5000 mg | ORAL_TABLET | Freq: Two times a day (BID) | ORAL | Status: DC | PRN
  Administered 2021-04-27: 0.5 mg via ORAL
  Filled 2021-04-27: qty 1

## 2021-04-27 MED ORDER — ENOXAPARIN SODIUM 40 MG/0.4ML IJ SOSY
40.0000 mg | PREFILLED_SYRINGE | INTRAMUSCULAR | Status: DC
  Administered 2021-04-27 – 2021-04-28 (×2): 40 mg via SUBCUTANEOUS
  Filled 2021-04-27 (×2): qty 0.4

## 2021-04-27 MED ORDER — NICOTINE 14 MG/24HR TD PT24: 14.0000 mg | MEDICATED_PATCH | Freq: Every day | TRANSDERMAL | Status: DC | PRN

## 2021-04-27 MED ORDER — ACETAMINOPHEN 325 MG PO TABS: 650.0000 mg | ORAL_TABLET | Freq: Four times a day (QID) | ORAL | Status: DC | PRN

## 2021-04-27 MED ORDER — UMECLIDINIUM-VILANTEROL 62.5-25 MCG/ACT IN AEPB: 1.0000 | INHALATION_SPRAY | Freq: Every day | RESPIRATORY_TRACT | Status: DC

## 2021-04-27 MED ORDER — BISACODYL 5 MG PO TBEC: 5.0000 mg | DELAYED_RELEASE_TABLET | Freq: Every day | ORAL | Status: DC | PRN

## 2021-04-27 MED ORDER — DOXYCYCLINE HYCLATE 100 MG PO TABS
100.0000 mg | ORAL_TABLET | Freq: Two times a day (BID) | ORAL | Status: DC
  Administered 2021-04-27 – 2021-04-29 (×4): 100 mg via ORAL
  Filled 2021-04-27 (×4): qty 1

## 2021-04-27 MED ORDER — IOHEXOL 300 MG/ML  SOLN: 100.0000 mL | Freq: Once | INTRAMUSCULAR | Status: DC | PRN

## 2021-04-27 MED ORDER — LORAZEPAM 2 MG/ML IJ SOLN
0.5000 mg | Freq: Once | INTRAMUSCULAR | Status: AC
  Administered 2021-04-27: 0.5 mg via INTRAVENOUS
  Filled 2021-04-27: qty 1

## 2021-04-27 MED ORDER — NICOTINE 21 MG/24HR TD PT24
21.0000 mg | MEDICATED_PATCH | Freq: Every day | TRANSDERMAL | Status: DC | PRN
  Administered 2021-04-27 – 2021-04-28 (×2): 21 mg via TRANSDERMAL
  Filled 2021-04-27 (×2): qty 1

## 2021-04-27 MED ORDER — ARFORMOTEROL TARTRATE 15 MCG/2ML IN NEBU
15.0000 ug | INHALATION_SOLUTION | Freq: Two times a day (BID) | RESPIRATORY_TRACT | Status: DC
  Administered 2021-04-27 – 2021-04-29 (×4): 15 ug via RESPIRATORY_TRACT
  Filled 2021-04-27 (×4): qty 2

## 2021-04-27 NOTE — ED Triage Notes (Signed)
Oxygen saturation 72% on room air on arrival, placed on oxygen at 3 liters  and sat increased to 92 %

## 2021-04-27 NOTE — Progress Notes (Signed)
**Note De-Identified Hayle Parisi Obfuscation** Patient removed from BIPAP by RN and placed on 2 L [place] for transport to ICU; tolerated well. RT delivered BIPAP to bedside.  Patient refused to be placed back on BIPAP, VS WNL, patient showing no distress.  RRT to continue to monitor.

## 2021-04-27 NOTE — ED Provider Notes (Signed)
[facility] Provider Note  CSN: [id number] Arrival date & time: 04/27/21 1119  Chief Complaint(s) No chief complaint on file.  HPI [name] is a 51 y.o. female with PMH severe COPD, DVT, lung abscess who presents emergency department for evaluation of shortness of breath and hypoxia.  Patient states that over the last 2 weeks she has had worsening dyspnea and mild hemoptysis.  She states that her symptoms are worse at night where she feels her heart is pounding out of her head.  She states that she wears 4 L nasal cannula at night but does not wear oxygen during the day.  Patient arrives saturating 72% on room air and was placed on 4 L at triage.  She endorses shortness of breath but denies chest pain, abdominal pain, nausea, vomiting or other systemic symptoms  HPI  Past Medical History Past Medical History:  Diagnosis Date   ASTHMA 02/11/2008   Qualifier: Diagnosis of  By: Wynetta Emery RN, Erika     Cholelithiasis    COPD (chronic obstructive pulmonary disease) ([place])    DVT 12/18/2008   Qualifier: Diagnosis of  By: Lamonte Sakai MD, Rose Fillers    DVT (deep venous thrombosis) ([place])    once occurred after trauma,completed coumadin 05/2009   Empyema    GERD (gastroesophageal reflux disease)    Hypertension    Insomnia    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   METHICILLIN SUSECPTIBLE PNEUMONIA STAPH AUREUS 02/11/2008   Qualifier: Diagnosis of  By: Wynetta Emery RN, Erika     MSSA (methicillin susceptible Staphylococcus aureus) pneumonia ([place])    Pulmonary nodules    Rectal bleeding 05/16/2011   Patient Active Problem List   Diagnosis Date Noted   Acute and chronic respiratory failure with hypercapnia ([place]) 04/27/2021   Angular cheilitis 07/06/2020   Tachycardia 06/22/2020   Insomnia 06/22/2020   Vitamin D deficiency 06/22/2020   Encounter to establish care 06/22/2020   Screening due 06/22/2020   Constipation 08/09/2019   Abdominal bloating 08/09/2019   Abdominal pain, epigastric  08/09/2019   FH: colon cancer 08/09/2019   History of colonic polyps 08/09/2019   GERD (gastroesophageal reflux disease) 07/29/2011   COPD (chronic obstructive pulmonary disease) ([place]) 06/17/2008   ANEMIA 02/11/2008   TOBACCO ABUSE 02/11/2008   Home Medication(s) Prior to Admission medications   Medication Sig Start Date End Date Taking? Authorizing Provider  albuterol (VENTOLIN HFA) 108 (90 Base) MCG/ACT inhaler Inhale 2 puffs into the lungs every 6 (six) hours as needed for wheezing or shortness of breath. 07/17/20   Icard, Octavio Graves, DO  ALPRAZolam (XANAX) 0.5 MG tablet Take 1 tablet (0.5 mg total) by mouth 2 (two) times daily as needed for anxiety or sleep. 10/12/20   Noreene Larsson, NP  azithromycin [facility]) 250 MG tablet Please dispense as a z-pack 10/12/20   Noreene Larsson, NP  Budeson-Glycopyrrol-Formoterol (BREZTRI AEROSPHERE) 160-9-4.8 MCG/ACT AERO Inhale 2 puffs into the lungs in the morning and at bedtime. 10/22/20   Garner Nash, DO  cetirizine (ZYRTEC) 10 MG tablet  06/22/20   [provider]  clotrimazole (LOTRIMIN) 1 % cream APPLY TO AFFECTED AREA TWICE A DAY 09/28/20   Noreene Larsson, NP  Fluticasone-Salmeterol (ADVAIR DISKUS) 250-50 MCG/DOSE AEPB Inhale 1 puff into the lungs daily. 07/29/20   Icard, Octavio Graves, DO  ipratropium-albuterol (DUONEB) 0.5-2.5 (3) MG/3ML SOLN Take 3 mLs by nebulization every 4 (four) hours as needed. 10/12/20   Noreene Larsson, NP  metoprolol succinate (  TOPROL-XL) 25 MG 24 hr tablet Take 25 mg by mouth 2 (two) times daily. 12/08/20   [provider]  metoprolol tartrate (LOPRESSOR) 25 MG tablet Take 1.5 tablets (37.5 mg total) by mouth daily. 06/22/20   Noreene Larsson, NP  montelukast (SINGULAIR) 10 MG tablet Take 1 tablet (10 mg total) by mouth at bedtime. 07/17/20   Icard, Octavio Graves, DO  omeprazole (PRILOSEC) 40 MG capsule Take 40 mg by mouth daily.    [provider]  OXYGEN Inhale into the lungs. 4 liters at bedtime    [provider]  theophylline (THEO-24) 400 MG 24 hr capsule Take 400 mg by mouth daily.    [provider]  tiotropium (SPIRIVA) 18 MCG inhalation capsule Place 1 capsule (18 mcg total) into inhaler and inhale daily. 07/29/20   Icard, Bradley L, DO  VENTOLIN HFA 108 (90 BASE) MCG/ACT inhaler INHALE 2 PUFFS INTO LUNGS EVERY 4 HOURS AS NEEDED FOR WHEEZING 06/09/12   Byrum, Rose Fillers, MD  Vitamin D, Ergocalciferol, (DRISDOL) 1.25 MG (50000 UNIT) CAPS capsule Take 1 capsule (50,000 Units total) by mouth every 7 (seven) days. 07/06/20   Noreene Larsson, NP  zolpidem (AMBIEN) 5 MG tablet Take 1 tablet (5 mg total) by mouth at bedtime as needed for sleep. 06/22/20   Noreene Larsson, NP                                                                                                                                    Past Surgical History Past Surgical History:  Procedure Laterality Date   BREAST BIOPSY  2014   left breast lumpectomy for "cancer cells"   CESAREAN SECTION  2005   x2   Chest tube placement  2009   Family History Family History  Problem Relation Age of Onset   Heart disease Mother    Diabetes Mother    Colon cancer Father    Throat cancer Father    Breast cancer Sister     Social History Social History   Tobacco Use   Smoking status: Every Day    Packs/day: 0.50    Years: 26.00    Pack years: 13.00    Types: Cigarettes    Start date: 1986   Smokeless tobacco: Never   Tobacco comments:    1 ciggs a day. Started at age 37.  Vaping Use   Vaping Use: Never used  Substance Use Topics   Alcohol use: Yes    Comment: mixed drink 1-2 times/month   Drug use: No   Allergies Cefaclor and Penicillins  Review of Systems Review of Systems  Constitutional:  Positive for fatigue.  Respiratory:  Positive for cough and shortness of breath.    Physical Exam Vital Signs  I have reviewed the triage vital signs BP (!) 143/77    Pulse 100    Temp 98.9 F (37.2 C) (Oral)  Resp  (!) 26    Ht 5\' 3"  (1.6 m)    Wt 59.4 kg    LMP 06/11/2019 (Approximate)    SpO2 92%    BMI 23.20 kg/m   Physical Exam Vitals and nursing note reviewed.  Constitutional:      General: She is not in acute distress.    Appearance: She is well-developed.  HENT:     Head: Normocephalic and atraumatic.  Eyes:     Conjunctiva/sclera: Conjunctivae normal.  Cardiovascular:     Rate and Rhythm: Normal rate and regular rhythm.     Heart sounds: No murmur heard. Pulmonary:     Effort: Respiratory distress present.     Breath sounds: Wheezing present.  Abdominal:     Palpations: Abdomen is soft.     Tenderness: There is no abdominal tenderness.  Musculoskeletal:        General: No swelling.     Cervical back: Neck supple.  Skin:    General: Skin is warm and dry.     Capillary Refill: Capillary refill takes less than 2 seconds.  Neurological:     Mental Status: She is alert.  Psychiatric:        Mood and Affect: Mood normal.    ED Results and Treatments Labs (all labs ordered are listed, but only abnormal results are displayed) Labs Reviewed  BASIC METABOLIC PANEL - Abnormal; Notable for the following components:      Result Value   CO2 38 (*)    Glucose, Bld 112 (*)    Creatinine, Ser 0.43 (*)    Calcium 8.8 (*)    All other components within normal limits  CBC - Abnormal; Notable for the following components:   Hemoglobin 15.1 (*)    HCT 48.4 (*)    MCV 105.4 (*)    All other components within normal limits  BLOOD GAS, VENOUS - Abnormal; Notable for the following components:   pCO2, Ven 83.3 (*)    pO2, Ven <31.0 (*)    Bicarbonate 33.1 (*)    Acid-Base Excess 13.9 (*)    All other components within normal limits  RESP PANEL BY RT-PCR (FLU A&B, COVID) ARPGX2  MRSA NEXT GEN BY PCR, NASAL  EXPECTORATED SPUTUM ASSESSMENT W GRAM STAIN, RFLX TO RESP C  HIV ANTIBODY (ROUTINE TESTING W REFLEX)  POC URINE PREG, ED  TROPONIN I (HIGH SENSITIVITY)  TROPONIN I (HIGH SENSITIVITY)                                                                                                                           Radiology DG Chest 2 View  Result Date: 04/27/2021 CLINICAL DATA:  Shortness of breath EXAM: CHEST - 2 VIEW COMPARISON:  10/13/2020 FINDINGS: Transverse diameter of heart is increased. Flattening of diaphragms and increase in AP diameter of chest suggest COPD. There are no new infiltrates or signs of pulmonary edema. Subtle increase in interstitial markings in the lower lung fields  has not changed. Blunting of lateral CP angles has not changed. This finding may suggest scarring in the pleura. Less likely possibility would be small pleural effusions. There is no pneumothorax. IMPRESSION: COPD.  There are no new infiltrates or signs of pulmonary edema. Electronically Signed   By: Elmer Picker M.D.   On: 04/27/2021 12:01   CT Angio Chest PE W and/or Wo Contrast  Result Date: 04/27/2021 CLINICAL DATA:  Shortness of breath, cough, hemoptysis EXAM: CT ANGIOGRAPHY CHEST WITH CONTRAST TECHNIQUE: Multidetector CT imaging of the chest was performed using the standard protocol during bolus administration of intravenous contrast. Multiplanar CT image reconstructions and MIPs were obtained to evaluate the vascular anatomy. CONTRAST:  111mL OMNIPAQUE IOHEXOL 350 MG/ML SOLN COMPARISON:  Chest radiograph done earlier today and CT done on 12/28/2020 FINDINGS: Cardiovascular: Scattered coronary artery calcifications are seen. There is homogeneous enhancement in thoracic aorta. Left vertebral artery is arising from the aortic arch. There are no intraluminal filling defects in the pulmonary artery branches. There is ectasia of main pulmonary artery measuring 3.7 cm suggesting pulmonary arterial hypertension. Mediastinum/Nodes: No significant lymphadenopathy seen. Lungs/Pleura: Centrilobular emphysema is seen. There is ectasia of bronchi in the right lower lung fields. There is subtle increase in  interstitial markings in the right lower lung fields suggesting scarring or interstitial pneumonitis. Small linear densities seen in the left lower lobe. There is no focal pulmonary consolidation. Upper Abdomen: Unremarkable. Musculoskeletal: Unremarkable. Review of the MIP images confirms the above findings. IMPRESSION: There is no evidence of pulmonary artery embolism. There is no evidence of thoracic aortic dissection. There is ectasia of main pulmonary artery suggesting pulmonary arterial hypertension. There are scattered coronary artery calcifications. COPD. There is ectasia of bronchi in the right lower lung fields. Increased interstitial markings are seen in the right lower lobe close to the diaphragm suggesting scarring or interstitial pneumonitis. There is no focal pulmonary consolidation. There is no pleural effusion or pneumothorax. Electronically Signed   By: Elmer Picker M.D.   On: 04/27/2021 13:39    Pertinent labs & imaging results that were available during my care of the patient were reviewed by me and considered in my medical decision making (see MDM for details).  Medications Ordered in ED Medications  Chlorhexidine Gluconate Cloth 2 % PADS 6 each (has no administration in time range)  enoxaparin (LOVENOX) injection 40 mg (40 mg Subcutaneous Given 04/27/21 1537)  methylPREDNISolone sodium succinate (SOLU-MEDROL) 125 mg/2 mL injection 60 mg (has no administration in time range)  doxycycline (VIBRA-TABS) tablet 100 mg (has no administration in time range)  acetaminophen (TYLENOL) tablet 650 mg (has no administration in time range)    Or  acetaminophen (TYLENOL) suppository 650 mg (has no administration in time range)  traZODone (DESYREL) tablet 25 mg (has no administration in time range)  bisacodyl (DULCOLAX) EC tablet 5 mg (has no administration in time range)  ondansetron (ZOFRAN) tablet 4 mg (has no administration in time range)    Or  ondansetron (ZOFRAN) injection 4 mg  (has no administration in time range)  pantoprazole (PROTONIX) EC tablet 40 mg (has no administration in time range)  nicotine (NICODERM CQ - dosed in mg/24 hours) patch 14 mg (has no administration in time range)  feeding supplement (ENSURE ENLIVE / ENSURE PLUS) liquid 237 mL (237 mLs Oral Given 04/27/21 1536)  influenza vac split quadrivalent PF (FLUARIX) injection 0.5 mL (has no administration in time range)  budesonide (PULMICORT) nebulizer solution 0.25 mg (has no administration in time  range)  arformoterol (BROVANA) nebulizer solution 15 mcg (has no administration in time range)  revefenacin (YUPELRI) nebulizer solution 175 mcg (has no administration in time range)  iohexol (OMNIPAQUE) 350 MG/ML injection 100 mL (100 mLs Intravenous Contrast Given 04/27/21 1320)  ipratropium-albuterol (DUONEB) 0.5-2.5 (3) MG/3ML nebulizer solution 9 mL (9 mLs Nebulization Given 04/27/21 1405)  methylPREDNISolone sodium succinate (SOLU-MEDROL) 125 mg/2 mL injection 125 mg (125 mg Intravenous Given 04/27/21 1416)  LORazepam (ATIVAN) injection 0.5 mg (0.5 mg Intravenous Given 04/27/21 1416)                                                                                                                                     Procedures .Critical Care Performed by: Teressa Lower, MD Authorized by: Teressa Lower, MD   Critical care provider statement:    Critical care time (minutes):  30   Critical care was necessary to treat or prevent imminent or life-threatening deterioration of the following conditions:  Respiratory failure   Critical care was time spent personally by me on the following activities:  Development of treatment plan with patient or surrogate, discussions with consultants, evaluation of patient's response to treatment, examination of patient, ordering and review of laboratory studies, ordering and review of radiographic studies, ordering and performing treatments and interventions, pulse oximetry,  re-evaluation of patient's condition and review of old charts  (including critical care time)  Medical Decision Making / ED Course   This patient presents to the ED for concern of respiratory stress, this involves an extensive number of treatment options, and is a complaint that carries with it a high risk of complications and morbidity.  The differential diagnosis includes COPD exacerbation, ACS, CHF exacerbation, PE Pneumonia    MDM: Patient seen emergency department for evaluation of shortness of breath.  Physical exam reveals patient in respiratory distress with an expiratory wheezing and accessory muscle use.  Laboratory evaluation with a PCO2 of 83.3 and a pH of 7.30, COVID and flu negative, troponin negative.  Chest x-ray with evidence of COPD but otherwise unremarkable.  CT PE with no pulmonary embolism.  Patient placed on BiPAP and admitted to the ICU.   Additional history obtained:  -External records from outside source obtained and reviewed including: Chart review including previous notes, labs, imaging, consultation notes   Lab Tests: -I ordered, reviewed, and interpreted labs.  The pertinent results include: PCO2 83.3, normal troponin   EKG  EKG Interpretation  Date/Time:    Ventricular Rate:    PR Interval:    QRS Duration:   QT Interval:    QTC Calculation:   R Axis:     Text Interpretation:           Imaging Studies ordered: I ordered imaging studies including CT PE, chest x-ray I independently visualized and interpreted imaging. I agree with the radiologist interpretation   Medicines ordered and prescription drug management: Meds ordered this encounter  Medications   DISCONTD: iohexol (OMNIPAQUE) 300 MG/ML solution 100 mL   iohexol (OMNIPAQUE) 350 MG/ML injection 100 mL   ipratropium-albuterol (DUONEB) 0.5-2.5 (3) MG/3ML nebulizer solution 9 mL   methylPREDNISolone sodium succinate (SOLU-MEDROL) 125 mg/2 mL injection 125 mg   LORazepam (ATIVAN)  injection 0.5 mg   Chlorhexidine Gluconate Cloth 2 % PADS 6 each   enoxaparin (LOVENOX) injection 40 mg   methylPREDNISolone sodium succinate (SOLU-MEDROL) 125 mg/2 mL injection 60 mg    IV methylprednisolone will be converted to either a q12h or q24h frequency with the same total daily dose (TDD).  Ordered Dose: 1 to 125 mg TDD; convert to: TDD q24h.  Ordered Dose: 126 to 250 mg TDD; convert to: TDD div q12h.  Ordered Dose: >250 mg TDD; DAW.   doxycycline (VIBRA-TABS) tablet 100 mg   DISCONTD: umeclidinium-vilanterol (ANORO ELLIPTA) 62.5-25 MCG/ACT 1 puff   OR Linked Order Group    acetaminophen (TYLENOL) tablet 650 mg    acetaminophen (TYLENOL) suppository 650 mg   traZODone (DESYREL) tablet 25 mg   bisacodyl (DULCOLAX) EC tablet 5 mg   OR Linked Order Group    ondansetron (ZOFRAN) tablet 4 mg    ondansetron (ZOFRAN) injection 4 mg   pantoprazole (PROTONIX) EC tablet 40 mg   nicotine (NICODERM CQ - dosed in mg/24 hours) patch 14 mg   feeding supplement (ENSURE ENLIVE / ENSURE PLUS) liquid 237 mL   influenza vac split quadrivalent PF (FLUARIX) injection 0.5 mL   budesonide (PULMICORT) nebulizer solution 0.25 mg   arformoterol (BROVANA) nebulizer solution 15 mcg   revefenacin (YUPELRI) nebulizer solution 175 mcg    -Reevaluation of the patient after these medicines showed that the patient improved -I have reviewed the patients home medicines and have made adjustments as needed  Critical interventions BiPAP     Cardiac Monitoring: The patient was maintained on a cardiac monitor.  I personally viewed and interpreted the cardiac monitored which showed an underlying rhythm of: Sinus tachycardia   Reevaluation: After the interventions noted above, I reevaluated the patient and found that they have :improved  Co morbidities that complicate the patient evaluation  Past Medical History:  Diagnosis Date   ASTHMA 02/11/2008   Qualifier: Diagnosis of  By: Wynetta Emery RN, Erika      Cholelithiasis    COPD (chronic obstructive pulmonary disease) ([place])    DVT 12/18/2008   Qualifier: Diagnosis of  By: Lamonte Sakai MD, Rose Fillers    DVT (deep venous thrombosis) ([place])    once occurred after trauma,completed coumadin 05/2009   Empyema    GERD (gastroesophageal reflux disease)    Hypertension    Insomnia    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

## 2021-04-27 NOTE — Telephone Encounter (Signed)
Received page from answering service to call patient regarding her feeling sick for 2 weeks, coughing up blood, and feeling short of breath.  Attempt to call patient at listed number but got routed to her voicemail.    She is followed by Dr. Tonia Brooms in pulmonary office.  Will route to Triage to follow up with patient this morning and schedule ROV with NP to assess further.

## 2021-04-27 NOTE — ED Notes (Signed)
Pt placed on cardiac monitor with BP to set cycle every 30 minutes. Continuous pulse oximeter applied.  

## 2021-04-27 NOTE — H&P (Signed)
History and Physical  [facility]  NEELAM KALISCH [id number] DOB: 1971/03/13 DOA: 04/27/2021  PCP: Pcp, No  Patient coming from: Home  Level of care: Stepdown  I have personally briefly reviewed patient's old medical records in [place]  Chief Complaint: severe SOB   HPI: [name] is a 51 y.o. female with medical history significant severe COPD, history of pulmonary empyema (MSSA), asthma, DVT, GERD, HTN, insomnia, rectal bleeding who reports that she has had increasing dyspnea and respiratory distress for the past 2 weeks, and reportedly started coughing up small amounts of bleed earlier this morning. She is followed by Dr. Valeta Harms with [facility]. She called the office today but didn't hear back and decided to come to the ED.  She wears oxygen at night only 4L/min. She was seen in ED today and noted to have an oxygen saturation of 72% on room air and placed on 4L Gas and oxygenation improved to 92%.  In addition, a venous blood gas showed a pCO3 greater than 80 and she was having severe dyspnea, severe wheezing and subsequently placed on bipap. She was sent for CT chest and the results showed no evidence of pneumonia or pulmonary embolus.  She was given IV solumedrol 125 mg and hospital admission was requested.   Review of Systems: Review of Systems  Constitutional:  Positive for chills and malaise/fatigue. Negative for diaphoresis, fever and weight loss.  HENT: Negative.    Eyes: Negative.   Respiratory:  Positive for cough, hemoptysis, sputum production, shortness of breath and wheezing.   Cardiovascular: Negative.   Gastrointestinal:  Positive for heartburn. Negative for abdominal pain, constipation, diarrhea, nausea and vomiting.  Genitourinary: Negative.   Musculoskeletal:  Positive for joint pain and myalgias. Negative for back pain, falls and neck pain.  Skin: Negative.   Neurological: Negative.   Endo/Heme/Allergies: Negative.   Psychiatric/Behavioral:   Negative for depression, hallucinations, memory loss, substance abuse and suicidal ideas. The patient is nervous/anxious and has insomnia.   All other systems reviewed and are negative.   Past Medical History:  Diagnosis Date   ASTHMA 02/11/2008   Qualifier: Diagnosis of  By: Wynetta Emery RN, Erika     Cholelithiasis    COPD (chronic obstructive pulmonary disease) ([place])    DVT 12/18/2008   Qualifier: Diagnosis of  By: Lamonte Sakai MD, Rose Fillers    DVT (deep venous thrombosis) ([place])    once occurred after trauma,completed coumadin 05/2009   Empyema    GERD (gastroesophageal reflux disease)    Hypertension    Insomnia    Lung abscess ([place]) 2009   METHICILLIN SUSECPTIBLE PNEUMONIA STAPH AUREUS 02/11/2008   Qualifier: Diagnosis of  By: Wynetta Emery RN, Erika     MSSA (methicillin susceptible Staphylococcus aureus) pneumonia ([place])    Pulmonary nodules    Rectal bleeding 05/16/2011    Past Surgical History:  Procedure Laterality Date   BREAST BIOPSY  2014   left breast lumpectomy for "cancer cells"   CESAREAN SECTION  2005   x2   Chest tube placement  2009     reports that she has been smoking cigarettes. She started smoking about 37 years ago. She has a 13.00 pack-year smoking history. She has never used smokeless tobacco. She reports current alcohol use. She reports that she does not use drugs.  Allergies  Allergen Reactions   Cefaclor    Penicillins     Family History  Problem Relation Age of Onset   Heart disease Mother  Diabetes Mother    Colon cancer Father    Throat cancer Father    Breast cancer Sister     Prior to Admission medications   Medication Sig Start Date End Date Taking? Authorizing Provider  albuterol (VENTOLIN HFA) 108 (90 Base) MCG/ACT inhaler Inhale 2 puffs into the lungs every 6 (six) hours as needed for wheezing or shortness of breath. 07/17/20   Icard, Octavio Graves, DO  ALPRAZolam (XANAX) 0.5 MG tablet Take 1 tablet (0.5 mg total) by mouth 2 (two) times daily as needed  for anxiety or sleep. 10/12/20   Noreene Larsson, NP  azithromycin [facility]) 250 MG tablet Please dispense as a z-pack 10/12/20   Noreene Larsson, NP  Budeson-Glycopyrrol-Formoterol (BREZTRI AEROSPHERE) 160-9-4.8 MCG/ACT AERO Inhale 2 puffs into the lungs in the morning and at bedtime. 10/22/20   Garner Nash, DO  cetirizine (ZYRTEC) 10 MG tablet  06/22/20   [provider]  clotrimazole (LOTRIMIN) 1 % cream APPLY TO AFFECTED AREA TWICE A DAY 09/28/20   Noreene Larsson, NP  Fluticasone-Salmeterol (ADVAIR DISKUS) 250-50 MCG/DOSE AEPB Inhale 1 puff into the lungs daily. 07/29/20   Icard, Octavio Graves, DO  ipratropium-albuterol (DUONEB) 0.5-2.5 (3) MG/3ML SOLN Take 3 mLs by nebulization every 4 (four) hours as needed. 10/12/20   Noreene Larsson, NP  metoprolol succinate (TOPROL-XL) 25 MG 24 hr tablet Take 25 mg by mouth 2 (two) times daily. 12/08/20   [provider]  metoprolol tartrate (LOPRESSOR) 25 MG tablet Take 1.5 tablets (37.5 mg total) by mouth daily. 06/22/20   Noreene Larsson, NP  montelukast (SINGULAIR) 10 MG tablet Take 1 tablet (10 mg total) by mouth at bedtime. 07/17/20   Icard, Octavio Graves, DO  omeprazole (PRILOSEC) 40 MG capsule Take 40 mg by mouth daily.    [provider]  OXYGEN Inhale into the lungs. 4 liters at bedtime    [provider]  theophylline (THEO-24) 400 MG 24 hr capsule Take 400 mg by mouth daily.    [provider]  tiotropium (SPIRIVA) 18 MCG inhalation capsule Place 1 capsule (18 mcg total) into inhaler and inhale daily. 07/29/20   Icard, Bradley L, DO  VENTOLIN HFA 108 (90 BASE) MCG/ACT inhaler INHALE 2 PUFFS INTO LUNGS EVERY 4 HOURS AS NEEDED FOR WHEEZING 06/09/12   Byrum, Rose Fillers, MD  Vitamin D, Ergocalciferol, (DRISDOL) 1.25 MG (50000 UNIT) CAPS capsule Take 1 capsule (50,000 Units total) by mouth every 7 (seven) days. 07/06/20   Noreene Larsson, NP  zolpidem (AMBIEN) 5 MG tablet Take 1 tablet (5 mg total) by mouth at bedtime as needed for  sleep. 06/22/20   Noreene Larsson, NP    Physical Exam: Vitals:   04/27/21 1346 04/27/21 1350 04/27/21 1400 04/27/21 1453  BP:      Pulse: 98 92  90  Resp: (!) 21 19  16   Temp:    98.9 F (37.2 C)  TempSrc:    Oral  SpO2: (!) 89% 93% 99% 97%  Weight:    59.4 kg  Height:    5\' 3"  (1.6 m)    Constitutional: very thin frail appearing female, awake and alert, but on bipap, appears uncomfortable.  Eyes: PERRL, lids and conjunctivae normal ENMT: on bipap.  Neck: normal, supple, no masses, no thyromegaly Respiratory: diffuse wheezing and rales heard bilaterally.  Cardiovascular: normal s1, s2 sounds, no murmurs / rubs / gallops. No extremity edema. 2+ pedal pulses. No carotid bruits.  Abdomen:  no tenderness, no masses palpated. No hepatosplenomegaly. Bowel sounds positive.  Musculoskeletal: no clubbing / cyanosis. No joint deformity upper and lower extremities. Good ROM, no contractures. Normal muscle tone.  Skin: no rashes, lesions, ulcers. No induration Neurologic: CN 2-12 grossly intact. Sensation intact, DTR normal. Strength 5/5 in all 4.  Psychiatric: Normal judgment and insight. Alert and oriented x 3. Normal mood.   Labs on Admission: I have personally reviewed following labs and imaging studies  CBC: Recent Labs  Lab 04/27/21 1157  WBC 7.9  HGB 15.1*  HCT 48.4*  MCV 105.4*  PLT 123XX123   Basic Metabolic Panel: Recent Labs  Lab 04/27/21 1157  NA 142  K 3.7  CL 99  CO2 38*  GLUCOSE 112*  BUN 9  CREATININE 0.43*  CALCIUM 8.8*   GFR: Estimated Creatinine Clearance: 68 mL/min (A) (by C-G formula based on SCr of 0.43 mg/dL (L)). Liver Function Tests: No results for input(s): AST, ALT, ALKPHOS, BILITOT, PROT, ALBUMIN in the last 168 hours. No results for input(s): LIPASE, AMYLASE in the last 168 hours. No results for input(s): AMMONIA in the last 168 hours. Coagulation Profile: No results for input(s): INR, PROTIME in the last 168 hours. Cardiac Enzymes: No results  for input(s): CKTOTAL, CKMB, CKMBINDEX, TROPONINI in the last 168 hours. BNP (last 3 results) No results for input(s): PROBNP in the last 8760 hours. HbA1C: No results for input(s): HGBA1C in the last 72 hours. CBG: No results for input(s): GLUCAP in the last 168 hours. Lipid Profile: No results for input(s): CHOL, HDL, LDLCALC, TRIG, CHOLHDL, LDLDIRECT in the last 72 hours. Thyroid Function Tests: No results for input(s): TSH, T4TOTAL, FREET4, T3FREE, THYROIDAB in the last 72 hours. Anemia Panel: No results for input(s): VITAMINB12, FOLATE, FERRITIN, TIBC, IRON, RETICCTPCT in the last 72 hours. Urine analysis:    Component Value Date/Time   COLORURINE YELLOW 07/31/2008 2054   APPEARANCEUR CLEAR 07/31/2008 2054   LABSPEC 1.020 07/31/2008 2054   PHURINE 6.0 07/31/2008 2054   GLUCOSEU NEGATIVE 07/31/2008 2054   HGBUR NEGATIVE 07/31/2008 2054   [place] NEGATIVE 07/31/2008 2054   [place] NEGATIVE 07/31/2008 2054   PROTEINUR NEGATIVE 07/31/2008 2054   UROBILINOGEN 0.2 07/31/2008 2054   NITRITE NEGATIVE 07/31/2008 2054   LEUKOCYTESUR  07/31/2008 2054    NEGATIVE MICROSCOPIC NOT DONE ON URINES WITH NEGATIVE PROTEIN, BLOOD, LEUKOCYTES, NITRITE, OR GLUCOSE <1000 mg/dL.    Radiological Exams on Admission: DG Chest 2 View  Result Date: 04/27/2021 CLINICAL DATA:  Shortness of breath EXAM: CHEST - 2 VIEW COMPARISON:  10/13/2020 FINDINGS: Transverse diameter of heart is increased. Flattening of diaphragms and increase in AP diameter of chest suggest COPD. There are no new infiltrates or signs of pulmonary edema. Subtle increase in interstitial markings in the lower lung fields has not changed. Blunting of lateral CP angles has not changed. This finding may suggest scarring in the pleura. Less likely possibility would be small pleural effusions. There is no pneumothorax. IMPRESSION: COPD.  There are no new infiltrates or signs of pulmonary edema. Electronically Signed   By: Elmer Picker M.D.   On: 04/27/2021 12:01   CT Angio Chest PE W and/or Wo Contrast  Result Date: 04/27/2021 CLINICAL DATA:  Shortness of breath, cough, hemoptysis EXAM: CT ANGIOGRAPHY CHEST WITH CONTRAST TECHNIQUE: Multidetector CT imaging of the chest was performed using the standard protocol during bolus administration of intravenous contrast. Multiplanar CT image reconstructions and MIPs were obtained to evaluate the vascular anatomy. CONTRAST:  125mL  OMNIPAQUE IOHEXOL 350 MG/ML SOLN COMPARISON:  Chest radiograph done earlier today and CT done on 12/28/2020 FINDINGS: Cardiovascular: Scattered coronary artery calcifications are seen. There is homogeneous enhancement in thoracic aorta. Left vertebral artery is arising from the aortic arch. There are no intraluminal filling defects in the pulmonary artery branches. There is ectasia of main pulmonary artery measuring 3.7 cm suggesting pulmonary arterial hypertension. Mediastinum/Nodes: No significant lymphadenopathy seen. Lungs/Pleura: Centrilobular emphysema is seen. There is ectasia of bronchi in the right lower lung fields. There is subtle increase in interstitial markings in the right lower lung fields suggesting scarring or interstitial pneumonitis. Small linear densities seen in the left lower lobe. There is no focal pulmonary consolidation. Upper Abdomen: Unremarkable. Musculoskeletal: Unremarkable. Review of the MIP images confirms the above findings. IMPRESSION: There is no evidence of pulmonary artery embolism. There is no evidence of thoracic aortic dissection. There is ectasia of main pulmonary artery suggesting pulmonary arterial hypertension. There are scattered coronary artery calcifications. COPD. There is ectasia of bronchi in the right lower lung fields. Increased interstitial markings are seen in the right lower lobe close to the diaphragm suggesting scarring or interstitial pneumonitis. There is no focal pulmonary consolidation. There is no  pleural effusion or pneumothorax. Electronically Signed   By: Elmer Picker M.D.   On: 04/27/2021 13:39    Assessment/Plan Principal Problem:   Acute and chronic respiratory failure with hypercapnia (HCC) Active Problems:   TOBACCO ABUSE   COPD (chronic obstructive pulmonary disease) (HCC)   GERD (gastroesophageal reflux disease)   Constipation   Vitamin D deficiency   Acute on chronic respiratory failure with hypercapnia and hypoxia  - Pt is now requiring bipap therapy due to severe hypercapnia  - admit to stepdown ICU - IV steroids, scheduled bronchodilators - antibiotics ordered - protonix for GI protection  - CTA chest negative for PE and negative for focal consolidation or pneumonia  - may need to involve pulmonary team pending response to treatments  - pt is high risk for intubation and wants to remain full code at this time   GERD  - protonix ordered  Tobacco  - nicotine patch ordered - pt was too ill to counsel on cessation at this time   Hypertension  - awaiting home meds to be reconciled for resumption orders   Critical Care Procedure Note Authorized and Performed by: Murvin Natal MD  Total Critical Care time:  55 mins  Due to a high probability of clinically significant, life threatening deterioration, the patient required my highest level of preparedness to intervene emergently and I personally spent this critical care time directly and personally managing the patient.  This critical care time included obtaining a history; examining the patient, pulse oximetry; ordering and review of studies; arranging urgent treatment with development of a management plan; evaluation of patient's response of treatment; frequent reassessment; and discussions with other providers.  This critical care time was performed to assess and manage the high probability of imminent and life threatening deterioration that could result in multi-organ failure.  It was exclusive of separately  billable procedures and treating other patients and teaching time.   DVT prophylaxis: enoxaparin   Code Status: Full   Family Communication: none present   Disposition Plan: anticipating home  Consults called: none  Admission status: INP  Level of care: Stepdown Irwin Brakeman MD [facility] How to contact the [facility] Attending or Consulting provider [place] or covering provider during after hours [place], for this patient?  Check the care team in [facility] and look for a) attending/consulting [facility] provider listed and b) the [facility] team listed Log into [email or web address] and use [place]'s universal password to access. If you do not have the password, please contact the hospital operator. Locate the [facility] provider you are looking for under [facility] and page to a number that you can be directly reached. If you still have difficulty reaching the provider, please page the [facility] (Director on Call) for the Hospitalists listed on amion for assistance.   If 7PM-7AM, please contact night-coverage [email or web address] Password [email or web address]  04/27/2021, 3:06 PM

## 2021-04-27 NOTE — Telephone Encounter (Signed)
Attempted to contact patient x3, phone line just rings busy.   Attempted to contact sister (emergency contact) same result, phone line busy.   No one listed on DPR.

## 2021-04-28 DIAGNOSIS — J441 Chronic obstructive pulmonary disease with (acute) exacerbation: Secondary | ICD-10-CM

## 2021-04-28 LAB — BASIC METABOLIC PANEL
Anion gap: 3 — ABNORMAL LOW (ref 5–15)
BUN: 9 mg/dL (ref 6–20)
CO2: 37 mmol/L — ABNORMAL HIGH (ref 22–32)
Calcium: 8.7 mg/dL — ABNORMAL LOW (ref 8.9–10.3)
Chloride: 99 mmol/L (ref 98–111)
Creatinine, Ser: 0.4 mg/dL — ABNORMAL LOW (ref 0.44–1.00)
GFR, Estimated: >60 mL/min (ref >60)
Glucose, Bld: 139 mg/dL — ABNORMAL HIGH (ref 70–99)
Potassium: 5 mmol/L (ref 3.5–5.1)
Sodium: 139 mmol/L (ref 135–145)

## 2021-04-28 LAB — CBC WITH DIFFERENTIAL/PLATELET
Abs Immature Granulocytes: 0.02 10*3/uL (ref 0.00–0.07)
Basophils Absolute: 0 10*3/uL (ref 0.0–0.1)
Basophils Relative: 0 %
Eosinophils Absolute: 0 10*3/uL (ref 0.0–0.5)
Eosinophils Relative: 0 %
HCT: 47.9 % — ABNORMAL HIGH (ref 36.0–46.0)
Hemoglobin: 14.7 g/dL (ref 12.0–15.0)
Immature Granulocytes: 0 %
Lymphocytes Relative: 10 %
Lymphs Abs: 0.8 10*3/uL (ref 0.7–4.0)
MCH: 32.7 pg (ref 26.0–34.0)
MCHC: 30.7 g/dL (ref 30.0–36.0)
MCV: 106.4 fL — ABNORMAL HIGH (ref 80.0–100.0)
Monocytes Absolute: 0.5 10*3/uL (ref 0.1–1.0)
Monocytes Relative: 6 %
Neutro Abs: 6.1 10*3/uL (ref 1.7–7.7)
Neutrophils Relative %: 84 %
Platelets: 203 10*3/uL (ref 150–400)
RBC: 4.5 MIL/uL (ref 3.87–5.11)
RDW: 14.9 % (ref 11.5–15.5)
WBC: 7.3 10*3/uL (ref 4.0–10.5)
nRBC: 0 % (ref 0.0–0.2)

## 2021-04-28 LAB — PROCALCITONIN: Procalcitonin: <0.1 ng/mL

## 2021-04-28 LAB — BRAIN NATRIURETIC PEPTIDE: B Natriuretic Peptide: 165 pg/mL — ABNORMAL HIGH (ref 0.0–100.0)

## 2021-04-28 MED ORDER — METHYLPREDNISOLONE SODIUM SUCC 125 MG IJ SOLR
60.0000 mg | Freq: Two times a day (BID) | INTRAMUSCULAR | Status: DC
  Administered 2021-04-28 – 2021-04-29 (×2): 60 mg via INTRAVENOUS
  Filled 2021-04-28 (×2): qty 2

## 2021-04-28 MED ORDER — ALPRAZOLAM 0.25 MG PO TABS
0.2500 mg | ORAL_TABLET | Freq: Two times a day (BID) | ORAL | Status: DC | PRN
  Administered 2021-04-28: 0.25 mg via ORAL
  Filled 2021-04-28: qty 1

## 2021-04-28 MED ORDER — BUDESONIDE 0.5 MG/2ML IN SUSP
0.5000 mg | Freq: Two times a day (BID) | RESPIRATORY_TRACT | Status: DC
  Administered 2021-04-28 – 2021-04-29 (×3): 0.5 mg via RESPIRATORY_TRACT
  Filled 2021-04-28 (×3): qty 2

## 2021-04-28 MED ORDER — ADULT MULTIVITAMIN W/MINERALS CH
1.0000 | ORAL_TABLET | Freq: Every day | ORAL | Status: DC
  Administered 2021-04-28 – 2021-04-29 (×2): 1 via ORAL
  Filled 2021-04-28 (×2): qty 1

## 2021-04-28 MED ORDER — BUSPIRONE HCL 5 MG PO TABS
5.0000 mg | ORAL_TABLET | Freq: Two times a day (BID) | ORAL | Status: DC
  Administered 2021-04-28 – 2021-04-29 (×3): 5 mg via ORAL
  Filled 2021-04-28 (×3): qty 1

## 2021-04-28 NOTE — Plan of Care (Signed)

## 2021-04-28 NOTE — Telephone Encounter (Signed)
ATC patient line  rings busy and never actually rings. Due to several attempts to contact patient will close this encounter. Nothing further needed at this time.

## 2021-04-28 NOTE — Progress Notes (Addendum)
Patient currently sitting up in bed on  6L [place]. No distress noted. Spoke with patient in regards to wearing BIPAP for HS use due to elevated CO2 upon arrival. Patient states she's "okay now". Explained importance of wearing BIPAP. Patient in agreement when she is ready for bed.

## 2021-04-28 NOTE — Progress Notes (Signed)
PROGRESS NOTE  [name] [id number] DOB: Jul 31, 1970 DOA: 04/27/2021 PCP: Pcp, No  Brief History:   51 y.o. female with medical history significant severe COPD, history of pulmonary empyema (MSSA), asthma, DVT, GERD, HTN, insomnia, rectal bleeding who reports that she has had increasing dyspnea and respiratory distress for the past 2 weeks, and reportedly started coughing up small amounts of bleed earlier this morning. She is followed by Dr. Valeta Harms with [facility].  She wears oxygen at night only 4L/min. She was seen in ED today and noted to have an oxygen saturation of 72% on room air and placed on 4L [place] and oxygenation improved to 92%.  In addition, a venous blood gas showed a pCO3 greater than 80 and she was having severe dyspnea, severe wheezing and subsequently placed on bipap. She was sent for CT chest and the results showed no evidence of pneumonia or pulmonary embolus.  She was given IV solumedrol 125 mg and hospital admission was requested.   Assessment/Plan: Acute on chronic respiratory failure with hypercapnia and hypoxia  - Pt is now requiring bipap therapy due to severe hypercapnia  - admitted to stepdown initially - 04/28/21--weaned off BiPAP - continue Yupelri, pulmicort, brovana - continue IV solumedrol - antibiotics ordered - protonix for GI protection  - CTA chest negative for PE and negative for focal consolidation or pneumonia  - 04/27/21 CTA chest--NEG PE, increase interstitial markings RLL-- scarring vs interstitial pneumonitis - pt is high risk for intubation and wants to remain full code at this time  -continue singulair   GERD  - protonix ordered   Tobacco  - nicotine patch ordered - cessation discussed   Hypertension  - continue metoprolol succinate -BP remains controlled  Anxiety -PDMP reviewed -start low dose buspar -start low dose xanax            Family Communication:   daughter updated at bedside 1/11  Consultants:   none  Code Status:  FULL   DVT Prophylaxis:  [place] Lovenox   Procedures: As Listed in Progress Note Above  Antibiotics: Doxy 1/10>>       Subjective: She is breathing better, but remains sob with exertion.  Denies cp, n/v/d, abd pain  Objective: Vitals:   04/28/21 1037 04/28/21 1100 04/28/21 1200 04/28/21 1219  BP:  125/71 (!) 115/56   Pulse: 80 92 93 92  Resp: 18 (!) 21 19 17   Temp:  98.1 F (36.7 C)    TempSrc:  Oral    SpO2: 96% 94% (!) 88% 90%  Weight:      Height:       No intake or output data in the 24 hours ending 04/28/21 1237 Weight change:  Exam:  General:  Pt is alert, follows commands appropriately, not in acute distress HEENT: No icterus, No thrush, No neck mass, [place]/AT Cardiovascular: RRR, S1/S2, no rubs, no gallops Respiratory: diminished BS.  Bibasilar wheeze Abdomen: Soft/+BS, non tender, non distended, no guarding Extremities: No edema, No lymphangitis, No petechiae, No rashes, no synovitis   Data Reviewed: I have personally reviewed following labs and imaging studies Basic Metabolic Panel: Recent Labs  Lab 04/27/21 1157 04/28/21 0401  NA 142 139  K 3.7 5.0  CL 99 99  CO2 38* 37*  GLUCOSE 112* 139*  BUN 9 9  CREATININE 0.43* 0.40*  CALCIUM 8.8* 8.7*  MG 2.0  --    Liver Function Tests: No results for input(s): AST,  ALT, ALKPHOS, BILITOT, PROT, ALBUMIN in the last 168 hours. No results for input(s): LIPASE, AMYLASE in the last 168 hours. No results for input(s): AMMONIA in the last 168 hours. Coagulation Profile: No results for input(s): INR, PROTIME in the last 168 hours. CBC: Recent Labs  Lab 04/27/21 1157 04/28/21 0401  WBC 7.9 7.3  NEUTROABS  --  6.1  HGB 15.1* 14.7  HCT 48.4* 47.9*  MCV 105.4* 106.4*  PLT 185 203   Cardiac Enzymes: No results for input(s): CKTOTAL, CKMB, CKMBINDEX, TROPONINI in the last 168 hours. BNP: Invalid input(s): POCBNP CBG: No results for input(s): GLUCAP in the last 168  hours. HbA1C: No results for input(s): HGBA1C in the last 72 hours. Urine analysis:    Component Value Date/Time   COLORURINE YELLOW 07/31/2008 2054   APPEARANCEUR CLEAR 07/31/2008 2054   LABSPEC 1.020 07/31/2008 2054   PHURINE 6.0 07/31/2008 2054   GLUCOSEU NEGATIVE 07/31/2008 2054   HGBUR NEGATIVE 07/31/2008 2054   [place] NEGATIVE 07/31/2008 2054   [place] NEGATIVE 07/31/2008 2054   PROTEINUR NEGATIVE 07/31/2008 2054   UROBILINOGEN 0.2 07/31/2008 2054   NITRITE NEGATIVE 07/31/2008 2054   LEUKOCYTESUR  07/31/2008 2054    NEGATIVE MICROSCOPIC NOT DONE ON URINES WITH NEGATIVE PROTEIN, BLOOD, LEUKOCYTES, NITRITE, OR GLUCOSE <1000 mg/dL.   Sepsis Labs: @LABRCNTIP (procalcitonin:4,lacticidven:4) ) Recent Results (from the past 240 hour(s))  Resp Panel by RT-PCR (Flu A&B, Covid) Nasopharyngeal Swab     Status: None   Collection Time: 04/27/21  1:50 PM   Specimen: Nasopharyngeal Swab; Nasopharyngeal(NP) swabs in vial transport medium  Result Value Ref Range Status   SARS Coronavirus 2 by RT PCR NEGATIVE NEGATIVE Final    Comment: (NOTE) SARS-CoV-2 target nucleic acids are NOT DETECTED.  The SARS-CoV-2 RNA is generally detectable in upper respiratory specimens during the acute phase of infection. The lowest concentration of SARS-CoV-2 viral copies this assay can detect is 138 copies/mL. A negative result does not preclude SARS-Cov-2 infection and should not be used as the sole basis for treatment or other patient management decisions. A negative result may occur with  improper specimen collection/handling, submission of specimen other than nasopharyngeal swab, presence of viral mutation(s) within the areas targeted by this assay, and inadequate number of viral copies(<138 copies/mL). A negative result must be combined with clinical observations, patient history, and epidemiological information. The expected result is Negative.  Fact Sheet for Patients:   EntrepreneurPulse.com.au  Fact Sheet for Healthcare Providers:  IncredibleEmployment.be  This test is no t yet approved or cleared by the [place] FDA and  has been authorized for detection and/or diagnosis of SARS-CoV-2 by FDA under an Emergency Use Authorization (EUA). This EUA will remain  in effect (meaning this test can be used) for the duration of the COVID-19 declaration under Section 564(b)(1) of the Act, 21 U.S.C.section 360bbb-3(b)(1), unless the authorization is terminated  or revoked sooner.       Influenza A by PCR NEGATIVE NEGATIVE Final   Influenza B by PCR NEGATIVE NEGATIVE Final    Comment: (NOTE) The Xpert Xpress SARS-CoV-2/FLU/RSV plus assay is intended as an aid in the diagnosis of influenza from Nasopharyngeal swab specimens and should not be used as a sole basis for treatment. Nasal washings and aspirates are unacceptable for Xpert Xpress SARS-CoV-2/FLU/RSV testing.  Fact Sheet for Patients: EntrepreneurPulse.com.au  Fact Sheet for Healthcare Providers: IncredibleEmployment.be  This test is not yet approved or cleared by the [place] FDA and has been authorized for detection and/or diagnosis  of SARS-CoV-2 by FDA under an Emergency Use Authorization (EUA). This EUA will remain in effect (meaning this test can be used) for the duration of the COVID-19 declaration under Section 564(b)(1) of the Act, 21 U.S.C. section 360bbb-3(b)(1), unless the authorization is terminated or revoked.  Performed at [facility], [address], [place], [place] [address]   MRSA Next Gen by PCR, Nasal     Status: None   Collection Time: 04/27/21  4:05 PM   Specimen: Nasal Mucosa; Nasal Swab  Result Value Ref Range Status   MRSA by PCR Next Gen NOT DETECTED NOT DETECTED Final    Comment: (NOTE) The GeneXpert MRSA Assay (FDA approved for NASAL specimens only), is one component of a  comprehensive MRSA colonization surveillance program. It is not intended to diagnose MRSA infection nor to guide or monitor treatment for MRSA infections. Test performance is not FDA approved in patients less than 97 years old. Performed at [facility], [address], [place], [place] [address]   Expectorated Sputum Assessment w Gram Stain, Rflx to Resp Cult     Status: None   Collection Time: 04/27/21  4:20 PM   Specimen: Expectorated Sputum  Result Value Ref Range Status   Specimen Description   Final    EXPECTORATED SPUTUM SPUTUM Performed at [facility], [address] [place], [place] [address]    Special Requests   Final    NONE Performed at [facility], [address], [place], [place] [address]    Sputum evaluation   Final    THIS SPECIMEN IS ACCEPTABLE FOR SPUTUM CULTURE Performed at [facility], [place] [address], [place], [place] [address]    Report Status 04/27/2021 FINAL  Final  Culture, Respiratory w Gram Stain     Status: None (Preliminary result)   Collection Time: 04/27/21  4:20 PM  Result Value Ref Range Status   Specimen Description EXPECTORATED SPUTUM SPUTUM  Final   Special Requests NONE Reflexed from [id number]  Final   Gram Stain   Final    RARE WBC PRESENT,BOTH PMN AND MONONUCLEAR FEW GRAM POSITIVE COCCI IN PAIRS FEW GRAM POSITIVE RODS FEW GRAM NEGATIVE RODS    Culture   Final    TOO YOUNG TO READ Performed at [facility], [place] [address], [place], [place] [address]    Report Status PENDING  Incomplete     Scheduled Meds:  arformoterol  15 mcg Nebulization BID   budesonide (PULMICORT) nebulizer solution  0.5 mg Nebulization BID   Chlorhexidine Gluconate Cloth  6 each Topical Q0600   doxycycline  100 mg Oral Q12H   enoxaparin (LOVENOX) injection  40 mg Subcutaneous Q24H   feeding supplement  237 mL Oral BID BM   methylPREDNISolone (SOLU-MEDROL) injection  60 mg Intravenous Q12H   metoprolol succinate  25 mg Oral BID   montelukast  10 mg  Oral QHS   pantoprazole  40 mg Oral Q0600   revefenacin  175 mcg Nebulization Daily   Continuous Infusions:  Procedures/Studies: DG Chest 2 View  Result Date: 04/27/2021 CLINICAL DATA:  Shortness of breath EXAM: CHEST - 2 VIEW COMPARISON:  10/13/2020 FINDINGS: Transverse diameter of heart is increased. Flattening of diaphragms and increase in AP diameter of chest suggest COPD. There are no new infiltrates or signs of pulmonary edema. Subtle increase in interstitial markings in the lower lung fields has not changed. Blunting of lateral CP angles has not changed. This finding may suggest scarring in the pleura.  Less likely possibility would be small pleural effusions. There is no pneumothorax. IMPRESSION: COPD.  There are no new infiltrates or signs of pulmonary edema. Electronically Signed   By: Elmer Picker M.D.   On: 04/27/2021 12:01   CT Angio Chest PE W and/or Wo Contrast  Result Date: 04/27/2021 CLINICAL DATA:  Shortness of breath, cough, hemoptysis EXAM: CT ANGIOGRAPHY CHEST WITH CONTRAST TECHNIQUE: Multidetector CT imaging of the chest was performed using the standard protocol during bolus administration of intravenous contrast. Multiplanar CT image reconstructions and MIPs were obtained to evaluate the vascular anatomy. CONTRAST:  139mL OMNIPAQUE IOHEXOL 350 MG/ML SOLN COMPARISON:  Chest radiograph done earlier today and CT done on 12/28/2020 FINDINGS: Cardiovascular: Scattered coronary artery calcifications are seen. There is homogeneous enhancement in thoracic aorta. Left vertebral artery is arising from the aortic arch. There are no intraluminal filling defects in the pulmonary artery branches. There is ectasia of main pulmonary artery measuring 3.7 cm suggesting pulmonary arterial hypertension. Mediastinum/Nodes: No significant lymphadenopathy seen. Lungs/Pleura: Centrilobular emphysema is seen. There is ectasia of bronchi in the right lower lung fields. There is subtle increase in  interstitial markings in the right lower lung fields suggesting scarring or interstitial pneumonitis. Small linear densities seen in the left lower lobe. There is no focal pulmonary consolidation. Upper Abdomen: Unremarkable. Musculoskeletal: Unremarkable. Review of the MIP images confirms the above findings. IMPRESSION: There is no evidence of pulmonary artery embolism. There is no evidence of thoracic aortic dissection. There is ectasia of main pulmonary artery suggesting pulmonary arterial hypertension. There are scattered coronary artery calcifications. COPD. There is ectasia of bronchi in the right lower lung fields. Increased interstitial markings are seen in the right lower lobe close to the diaphragm suggesting scarring or interstitial pneumonitis. There is no focal pulmonary consolidation. There is no pleural effusion or pneumothorax. Electronically Signed   By: Elmer Picker M.D.   On: 04/27/2021 13:39    Orson Eva, DO  [facility]  If 7PM-7AM, please contact night-coverage [email or web address] Password [facility] 04/28/2021, 12:37 PM   LOS: 1 day

## 2021-04-28 NOTE — Progress Notes (Signed)
Initial Nutrition Assessment  DOCUMENTATION CODES:   Non-severe (moderate) malnutrition in context of chronic illness  INTERVENTION:  Ensure Enlive po BID, each supplement provides 350 kcal and 20 grams of protein   Recommend check B-12 and vitamin D  NUTRITION DIAGNOSIS:   Moderate Malnutrition related to chronic illness, decreased appetite, acute illness (acute on chronic severe COPD) as evidenced by mild fat depletion, moderate fat depletion, mild muscle depletion, moderate muscle depletion and intake < 75% for >/= 1 month.    GOAL:  Provide needs based on ASPEN/SCCM guidelines   MONITOR:  PO intake, Supplement acceptance, Labs, Weight trends  REASON FOR ASSESSMENT:   Malnutrition Screening Tool    ASSESSMENT: patient is a 51 yo female with hx of severe COPD, pulmonary empyema, asthma, GERD, HTN. Presents with acute on chronic respiratory failure with hypercapnia and hypoxia.   Patient recalls going 3-4 days with drinking only Pepsi. Reports problem with feeling full/ bloated after a few bites and this has been going on for some time. She had follow up with GI which didn't happen due to various circumstances. Patient likes to cook but doesn't anymore due to persistent weakness and lack of energy. Predicted chronic suboptimal nutrition intake based on diet recall.  Discussed with her the importance of balanced nutrition daily and provided education about healthy diet and encouraged high to consider high calorie high protein supplements on days intake is poor.  Weight loss trend 3.2 kg (5%) x 6 months based on hospital records.   Labs reviewed:  BMP Latest Ref Rng & Units 04/28/2021 04/27/2021 07/28/2020  Glucose 70 - 99 mg/dL 333(L) 456(Y) 563(S)  BUN 6 - 20 mg/dL 9 9 8   Creatinine 0.44 - 1.00 mg/dL ) 9.37(D) 4.28(J  BUN/Creat Ratio 9 - 23 - - -  Sodium 135 - 145 mmol/L 139 142 135  Potassium 3.5 - 5.1 mmol/L 5.0 3.7 3.9  Chloride 98 - 111 mmol/L 99 99 100  CO2 22 - 32  mmol/L 37(H) 38(H) 26  Calcium 8.9 - 10.3 mg/dL 6.81) 1.5(B) 2.6(O)     NUTRITION - FOCUSED PHYSICAL EXAM:  Flowsheet Row Most Recent Value  Orbital Region Severe depletion  Upper Arm Region Mild depletion  Thoracic and Lumbar Region Moderate depletion  Buccal Region Mild depletion  Temple Region Moderate depletion  Clavicle Bone Region Moderate depletion  Clavicle and Acromion Bone Region Mild depletion  Scapular Bone Region No depletion  Dorsal Hand Mild depletion  Patellar Region Moderate depletion  Anterior Thigh Region Moderate depletion  Edema (RD Assessment) None  Hair Reviewed  Eyes Reviewed  Mouth Reviewed  Skin Reviewed      Diet Order:   Diet Order             Diet Heart Room service appropriate? Yes; Fluid consistency: Thin  Diet effective now                   EDUCATION NEEDS:  Education needs have been addressed  Skin:  Skin Assessment: Reviewed RN Assessment  Last BM:  1/9  Height:   Ht Readings from Last 1 Encounters:  04/27/21 5\' 3"  (1.6 m)    Weight:   Wt Readings from Last 1 Encounters:  04/27/21 59.4 kg    Ideal Body Weight:   52.2 kg  BMI:  Body mass index is 23.2 kg/m.  Estimated Nutritional Needs:   Kcal:  1782-2000  Protein:  83-89 gr  Fluid:  >1600 ml daily    MS,RD,CSG,LDN Contact: AMION

## 2021-04-29 ENCOUNTER — Ambulatory Visit: Payer: Medicaid Other | Admitting: Primary Care

## 2021-04-29 DIAGNOSIS — E440 Moderate protein-calorie malnutrition: Secondary | ICD-10-CM

## 2021-04-29 MED ORDER — BUSPIRONE HCL 5 MG PO TABS: 5.0000 mg | ORAL_TABLET | Freq: Two times a day (BID) | ORAL | 1 refills | Status: AC

## 2021-04-29 MED ORDER — NICOTINE 21 MG/24HR TD PT24: 21.0000 mg | MEDICATED_PATCH | Freq: Every day | TRANSDERMAL | 0 refills | Status: DC | PRN

## 2021-04-29 MED ORDER — ALPRAZOLAM 0.25 MG PO TABS: 0.2500 mg | ORAL_TABLET | Freq: Two times a day (BID) | ORAL | 0 refills | Status: DC | PRN

## 2021-04-29 MED ORDER — PREDNISONE 50 MG PO TABS: 50.0000 mg | ORAL_TABLET | Freq: Every day | ORAL | 0 refills | Status: AC

## 2021-04-29 MED ORDER — DOXYCYCLINE HYCLATE 100 MG PO TABS: 100.0000 mg | ORAL_TABLET | Freq: Two times a day (BID) | ORAL | 0 refills | Status: DC

## 2021-04-29 NOTE — Discharge Summary (Addendum)
Physician Discharge Summary  [name] LNL:[id number] DOB: 15-Jul-1970 DOA: 04/27/2021  PCP: Pcp, No  Admit date: 04/27/2021 Discharge date: 04/29/2021  Admitted From: Home Disposition:  Home   Recommendations for Outpatient Follow-up:  Follow up with PCP in 1-2 weeks Please obtain BMP/CBC in one week    Equipment/Devices: 3.5 L oxygen  Discharge Condition: Stable CODE STATUS: FULL Diet recommendation: Heart Healthy   Brief/Interim Summary:  51 y.o. female with medical history significant severe COPD, history of pulmonary empyema (MSSA), asthma, DVT, GERD, HTN, insomnia, rectal bleeding who reports that she has had increasing dyspnea and respiratory distress for the past 2 weeks, and reportedly started coughing up small amounts of bleed earlier this morning. She is followed by Dr. Tonia Brooms with [facility].  She wears oxygen at night only 4L/min. She was seen in ED today and noted to have an oxygen saturation of 72% on room air and placed on 4L [place] and oxygenation improved to 92%.  In addition, a venous blood gas showed a pCO3 greater than 80 and she was having severe dyspnea, severe wheezing and subsequently placed on bipap. She was sent for CT chest and the results showed no evidence of pneumonia or pulmonary embolus.  She was given IV solumedrol 125 mg and hospital admission was requested.     Discharge Diagnoses:  Acute on chronic respiratory failure with hypercapnia and hypoxia  - Pt is now requiring bipap therapy due to severe hypercapnia  - admitted to stepdown initially - 04/28/21--weaned off BiPAP - started on Yupelri, pulmicort, brovana - continue IV solumedrol>>d/c home with prednisone x 4 more days - antibiotics ordered>>d/c home with doxy x 4 more days - protonix for GI protection  - CTA chest negative for PE and negative for focal consolidation or pneumonia  - 04/27/21 CTA chest--NEG PE, increase interstitial markings RLL-- scarring vs interstitial  pneumonitis - pt is high risk for intubation and wants to remain full code at this time  -continue singulair -on day of d/c, she desaturated to 74% on RA>>d/c home with 3.5L oxygen   GERD  - protonix ordered   Tobacco  - nicotine patch ordered - cessation discussed   Hypertension  - continue metoprolol succinate -BP remains controlled   Anxiety -PDMP reviewed -started low dose buspar -started low dose xanax  Moderate malnutrition -continue supplements   Discharge Instructions   Allergies as of 04/29/2021       Reactions   Cefaclor    Penicillins         Medication List     TAKE these medications    ALPRAZolam 0.25 MG tablet Commonly known as: XANAX Take 1 tablet (0.25 mg total) by mouth 2 (two) times daily as needed for anxiety. What changed:  medication strength how much to take reasons to take this   benzonatate 100 MG capsule Commonly known as: TESSALON Take 200 mg by mouth 3 (three) times daily as needed.   busPIRone 5 MG tablet Commonly known as: BUSPAR Take 1 tablet (5 mg total) by mouth 2 (two) times daily.   doxycycline 100 MG tablet Commonly known as: VIBRA-TABS Take 1 tablet (100 mg total) by mouth every 12 (twelve) hours.   ipratropium-albuterol 0.5-2.5 (3) MG/3ML Soln Commonly known as: DUONEB Take 3 mLs by nebulization every 4 (four) hours as needed.   metoprolol succinate 25 MG 24 hr tablet Commonly known as: TOPROL-XL Take 25 mg by mouth 2 (two) times daily.   montelukast 10 MG tablet Commonly known  as: SINGULAIR Take 1 tablet (10 mg total) by mouth at bedtime.   omeprazole 40 MG capsule Commonly known as: PRILOSEC Take 40 mg by mouth daily.   OXYGEN Inhale into the lungs. 4 liters at bedtime   predniSONE 50 MG tablet Commonly known as: DELTASONE Take 1 tablet (50 mg total) by mouth daily.   theophylline 400 MG 24 hr capsule Commonly known as: THEO-24 Take 400 mg by mouth daily.   tiotropium 18 MCG inhalation  capsule Commonly known as: SPIRIVA Place 1 capsule (18 mcg total) into inhaler and inhale daily.   Ventolin HFA 108 (90 Base) MCG/ACT inhaler Generic drug: albuterol INHALE 2 PUFFS INTO LUNGS EVERY 4 HOURS AS NEEDED FOR WHEEZING What changed: See the new instructions.   albuterol 108 (90 Base) MCG/ACT inhaler Commonly known as: VENTOLIN HFA Inhale 2 puffs into the lungs every 6 (six) hours as needed for wheezing or shortness of breath. What changed: Another medication with the same name was changed. Make sure you understand how and when to take each.   zolpidem 5 MG tablet Commonly known as: AMBIEN Take 1 tablet (5 mg total) by mouth at bedtime as needed for sleep.               Durable Medical Equipment  (From admission, onward)           Start     Ordered   04/29/21 0955  For home use only DME oxygen  Once       Question Answer Comment  Length of Need Lifetime   Mode or (Route) Nasal cannula   Liters per Minute 3.5   Frequency Continuous (stationary and portable oxygen unit needed)   Oxygen conserving device Yes   Oxygen delivery system Gas      04/29/21 0955            Allergies  Allergen Reactions   Cefaclor    Penicillins     Consultations: none   Procedures/Studies: DG Chest 2 View  Result Date: 04/27/2021 CLINICAL DATA:  Shortness of breath EXAM: CHEST - 2 VIEW COMPARISON:  10/13/2020 FINDINGS: Transverse diameter of heart is increased. Flattening of diaphragms and increase in AP diameter of chest suggest COPD. There are no new infiltrates or signs of pulmonary edema. Subtle increase in interstitial markings in the lower lung fields has not changed. Blunting of lateral CP angles has not changed. This finding may suggest scarring in the pleura. Less likely possibility would be small pleural effusions. There is no pneumothorax. IMPRESSION: COPD.  There are no new infiltrates or signs of pulmonary edema. Electronically Signed   By: Ernie Avena M.D.   On: 04/27/2021 12:01   CT Angio Chest PE W and/or Wo Contrast  Result Date: 04/27/2021 CLINICAL DATA:  Shortness of breath, cough, hemoptysis EXAM: CT ANGIOGRAPHY CHEST WITH CONTRAST TECHNIQUE: Multidetector CT imaging of the chest was performed using the standard protocol during bolus administration of intravenous contrast. Multiplanar CT image reconstructions and MIPs were obtained to evaluate the vascular anatomy. CONTRAST:  OMNIPAQUE IOHEXOL 350 MG/ML SOLN COMPARISON:  Chest radiograph done earlier today and CT done on 12/28/2020 FINDINGS: Cardiovascular: Scattered coronary artery calcifications are seen. There is homogeneous enhancement in thoracic aorta. Left vertebral artery is arising from the aortic arch. There are no intraluminal filling defects in the pulmonary artery branches. There is ectasia of main pulmonary artery measuring 3.7 cm suggesting pulmonary arterial hypertension. Mediastinum/Nodes: No significant lymphadenopathy seen. Lungs/Pleura: Centrilobular emphysema is seen.  There is ectasia of bronchi in the right lower lung fields. There is subtle increase in interstitial markings in the right lower lung fields suggesting scarring or interstitial pneumonitis. Small linear densities seen in the left lower lobe. There is no focal pulmonary consolidation. Upper Abdomen: Unremarkable. Musculoskeletal: Unremarkable. Review of the MIP images confirms the above findings. IMPRESSION: There is no evidence of pulmonary artery embolism. There is no evidence of thoracic aortic dissection. There is ectasia of main pulmonary artery suggesting pulmonary arterial hypertension. There are scattered coronary artery calcifications. COPD. There is ectasia of bronchi in the right lower lung fields. Increased interstitial markings are seen in the right lower lobe close to the diaphragm suggesting scarring or interstitial pneumonitis. There is no focal pulmonary consolidation. There is no  pleural effusion or pneumothorax. Electronically Signed   By: Ernie AvenaPalani  Rathinasamy M.D.   On: 04/27/2021 13:39        Discharge Exam: Vitals:   04/29/21 0619 04/29/21 0726  BP: 122/78   Pulse: 81   Resp: 16   Temp: 97.6 F (36.4 C)   SpO2: 91% 95%   Vitals:   04/28/21 1934 04/28/21 2201 04/29/21 0619 04/29/21 0726  BP:  121/64 122/78   Pulse: 91 88 81   Resp: 20 20 16    Temp:  98.4 F (36.9 C) 97.6 F (36.4 C)   TempSrc:  Oral Oral   SpO2: 90% 94% 91% 95%  Weight:      Height:        General: Pt is alert, awake, not in acute distress Cardiovascular: RRR, S1/S2 +, no rubs, no gallops Respiratory: bibasilar rales. Diminished BS.  No wheeze Abdominal: Soft, NT, ND, bowel sounds + Extremities: no edema, no cyanosis   The results of significant diagnostics from this hospitalization (including imaging, microbiology, ancillary and laboratory) are listed below for reference.    Significant Diagnostic Studies: DG Chest 2 View  Result Date: 04/27/2021 CLINICAL DATA:  Shortness of breath EXAM: CHEST - 2 VIEW COMPARISON:  10/13/2020 FINDINGS: Transverse diameter of heart is increased. Flattening of diaphragms and increase in AP diameter of chest suggest COPD. There are no new infiltrates or signs of pulmonary edema. Subtle increase in interstitial markings in the lower lung fields has not changed. Blunting of lateral CP angles has not changed. This finding may suggest scarring in the pleura. Less likely possibility would be small pleural effusions. There is no pneumothorax. IMPRESSION: COPD.  There are no new infiltrates or signs of pulmonary edema. Electronically Signed   By: Ernie AvenaPalani  Rathinasamy M.D.   On: 04/27/2021 12:01   CT Angio Chest PE W and/or Wo Contrast  Result Date: 04/27/2021 CLINICAL DATA:  Shortness of breath, cough, hemoptysis EXAM: CT ANGIOGRAPHY CHEST WITH CONTRAST TECHNIQUE: Multidetector CT imaging of the chest was performed using the standard protocol during  bolus administration of intravenous contrast. Multiplanar CT image reconstructions and MIPs were obtained to evaluate the vascular anatomy. CONTRAST:  100mL OMNIPAQUE IOHEXOL 350 MG/ML SOLN COMPARISON:  Chest radiograph done earlier today and CT done on 12/28/2020 FINDINGS: Cardiovascular: Scattered coronary artery calcifications are seen. There is homogeneous enhancement in thoracic aorta. Left vertebral artery is arising from the aortic arch. There are no intraluminal filling defects in the pulmonary artery branches. There is ectasia of main pulmonary artery measuring 3.7 cm suggesting pulmonary arterial hypertension. Mediastinum/Nodes: No significant lymphadenopathy seen. Lungs/Pleura: Centrilobular emphysema is seen. There is ectasia of bronchi in the right lower lung fields. There is subtle increase in interstitial  markings in the right lower lung fields suggesting scarring or interstitial pneumonitis. Small linear densities seen in the left lower lobe. There is no focal pulmonary consolidation. Upper Abdomen: Unremarkable. Musculoskeletal: Unremarkable. Review of the MIP images confirms the above findings. IMPRESSION: There is no evidence of pulmonary artery embolism. There is no evidence of thoracic aortic dissection. There is ectasia of main pulmonary artery suggesting pulmonary arterial hypertension. There are scattered coronary artery calcifications. COPD. There is ectasia of bronchi in the right lower lung fields. Increased interstitial markings are seen in the right lower lobe close to the diaphragm suggesting scarring or interstitial pneumonitis. There is no focal pulmonary consolidation. There is no pleural effusion or pneumothorax. Electronically Signed   By: Ernie AvenaPalani  Rathinasamy M.D.   On: 04/27/2021 13:39    Microbiology: Recent Results (from the past 240 hour(s))  Resp Panel by RT-PCR (Flu A&B, Covid) Nasopharyngeal Swab     Status: None   Collection Time: 04/27/21  1:50 PM   Specimen:  Nasopharyngeal Swab; Nasopharyngeal(NP) swabs in vial transport medium  Result Value Ref Range Status   SARS Coronavirus 2 by RT PCR NEGATIVE NEGATIVE Final    Comment: (NOTE) SARS-CoV-2 target nucleic acids are NOT DETECTED.  The SARS-CoV-2 RNA is generally detectable in upper respiratory specimens during the acute phase of infection. The lowest concentration of SARS-CoV-2 viral copies this assay can detect is 138 copies/mL. A negative result does not preclude SARS-Cov-2 infection and should not be used as the sole basis for treatment or other patient management decisions. A negative result may occur with  improper specimen collection/handling, submission of specimen other than nasopharyngeal swab, presence of viral mutation(s) within the areas targeted by this assay, and inadequate number of viral copies(<138 copies/mL). A negative result must be combined with clinical observations, patient history, and epidemiological information. The expected result is Negative.  Fact Sheet for Patients:  BloggerCourse.comhttps://www.fda.gov/media/152166/download  Fact Sheet for Healthcare Providers:  SeriousBroker.ithttps://www.fda.gov/media/152162/download  This test is no t yet approved or cleared by the [place] FDA and  has been authorized for detection and/or diagnosis of SARS-CoV-2 by FDA under an Emergency Use Authorization (EUA). This EUA will remain  in effect (meaning this test can be used) for the duration of the COVID-19 declaration under Section 564(b)(1) of the Act, 21 U.S.C.section 360bbb-3(b)(1), unless the authorization is terminated  or revoked sooner.       Influenza A by PCR NEGATIVE NEGATIVE Final   Influenza B by PCR NEGATIVE NEGATIVE Final    Comment: (NOTE) The Xpert Xpress SARS-CoV-2/FLU/RSV plus assay is intended as an aid in the diagnosis of influenza from Nasopharyngeal swab specimens and should not be used as a sole basis for treatment. Nasal washings and aspirates are unacceptable for  Xpert Xpress SARS-CoV-2/FLU/RSV testing.  Fact Sheet for Patients: BloggerCourse.comhttps://www.fda.gov/media/152166/download  Fact Sheet for Healthcare Providers: SeriousBroker.ithttps://www.fda.gov/media/152162/download  This test is not yet approved or cleared by the [place] FDA and has been authorized for detection and/or diagnosis of SARS-CoV-2 by FDA under an Emergency Use Authorization (EUA). This EUA will remain in effect (meaning this test can be used) for the duration of the COVID-19 declaration under Section 564(b)(1) of the Act, 21 U.S.C. section 360bbb-3(b)(1), unless the authorization is terminated or revoked.  Performed at [facility], [address] [place], [place] [id number]   MRSA Next Gen by PCR, Nasal     Status: None   Collection Time: 04/27/21  4:05 PM   Specimen: Nasal Mucosa; Nasal Swab  Result Value  Ref Range Status   MRSA by PCR Next Gen NOT DETECTED NOT DETECTED Final    Comment: (NOTE) The GeneXpert MRSA Assay (FDA approved for NASAL specimens only), is one component of a comprehensive MRSA colonization surveillance program. It is not intended to diagnose MRSA infection nor to guide or monitor treatment for MRSA infections. Test performance is not FDA approved in patients less than 39 years old. Performed at [facility], [address], [place], [place] [address]   Expectorated Sputum Assessment w Gram Stain, Rflx to Resp Cult     Status: None   Collection Time: 04/27/21  4:20 PM   Specimen: Expectorated Sputum  Result Value Ref Range Status   Specimen Description   Final    EXPECTORATED SPUTUM SPUTUM Performed at [facility], [address], [place], [place] [address]    Special Requests   Final    NONE Performed at [facility], [address], [place], [place] [address]    Sputum evaluation   Final    THIS SPECIMEN IS ACCEPTABLE FOR SPUTUM CULTURE Performed at [facility], [address], [place], [place] [address]    Report Status 04/27/2021 FINAL  Final   Culture, Respiratory w Gram Stain     Status: None (Preliminary result)   Collection Time: 04/27/21  4:20 PM  Result Value Ref Range Status   Specimen Description EXPECTORATED SPUTUM SPUTUM  Final   Special Requests NONE Reflexed from [id number]  Final   Gram Stain   Final    RARE WBC PRESENT,BOTH PMN AND MONONUCLEAR FEW GRAM POSITIVE COCCI IN PAIRS FEW GRAM POSITIVE RODS FEW GRAM NEGATIVE RODS    Culture   Final    CULTURE REINCUBATED FOR BETTER GROWTH Performed at [facility], [address] [place], [place] [address]    Report Status PENDING  Incomplete     Labs: Basic Metabolic Panel: Recent Labs  Lab 04/27/21 1157 04/28/21 0401  NA 142 139  K 3.7 5.0  CL 99 99  CO2 38* 37*  GLUCOSE 112* 139*  BUN 9 9  CREATININE 0.43* 0.40*  CALCIUM 8.8* 8.7*  MG 2.0  --    Liver Function Tests: No results for input(s): AST, ALT, ALKPHOS, BILITOT, PROT, ALBUMIN in the last 168 hours. No results for input(s): LIPASE, AMYLASE in the last 168 hours. No results for input(s): AMMONIA in the last 168 hours. CBC: Recent Labs  Lab 04/27/21 1157 04/28/21 0401  WBC 7.9 7.3  NEUTROABS  --  6.1  HGB 15.1* 14.7  HCT 48.4* 47.9*  MCV 105.4* 106.4*  PLT 185 203   Cardiac Enzymes: No results for input(s): CKTOTAL, CKMB, CKMBINDEX, TROPONINI in the last 168 hours. BNP: Invalid input(s): POCBNP CBG: No results for input(s): GLUCAP in the last 168 hours.  Time coordinating discharge:  36 minutes  Signed:  Catarina Hartshorn, DO [facility] Pager: [phone] 04/29/2021, 10:03 AM

## 2021-04-29 NOTE — Progress Notes (Signed)
Patient already on continuous oxygen per Jeneen Rinks with [facility].    Angeliah Wisdom, Clydene Pugh, LCSW

## 2021-04-29 NOTE — Progress Notes (Signed)
SATURATION QUALIFICATIONS: (This note is used to comply with regulatory documentation for home oxygen)   Patient Saturations on Room Air at Rest = 74   Patient Saturations on Room Air while Ambulating = N/A   Patient Saturations on 3.5 Liters of oxygen while Ambulating = 91   Please briefly explain why patient needs home oxygen: To maintain 02 sat at 90% or above during ambulation.  Onalee Hua Telisa Ohlsen,DO

## 2021-05-01 LAB — CULTURE, RESPIRATORY W GRAM STAIN

## 2021-05-10 ENCOUNTER — Encounter: Payer: Self-pay | Admitting: Pulmonary Disease

## 2021-05-10 ENCOUNTER — Other Ambulatory Visit: Payer: Self-pay

## 2021-05-10 ENCOUNTER — Ambulatory Visit (INDEPENDENT_AMBULATORY_CARE_PROVIDER_SITE_OTHER): Payer: Medicaid Other | Admitting: Pulmonary Disease

## 2021-05-10 VITALS — BP 116/56 | HR 109 | Temp 98.1°F | Ht 63.0 in | Wt 130.8 lb

## 2021-05-10 DIAGNOSIS — J449 Chronic obstructive pulmonary disease, unspecified: Secondary | ICD-10-CM

## 2021-05-10 DIAGNOSIS — Z720 Tobacco use: Secondary | ICD-10-CM

## 2021-05-10 DIAGNOSIS — F17200 Nicotine dependence, unspecified, uncomplicated: Secondary | ICD-10-CM

## 2021-05-10 DIAGNOSIS — F17210 Nicotine dependence, cigarettes, uncomplicated: Secondary | ICD-10-CM | POA: Diagnosis not present

## 2021-05-10 DIAGNOSIS — Z716 Tobacco abuse counseling: Secondary | ICD-10-CM

## 2021-05-10 MED ORDER — AZITHROMYCIN 250 MG PO TABS: 250.0000 mg | ORAL_TABLET | ORAL | 2 refills | Status: AC

## 2021-05-10 NOTE — Progress Notes (Signed)
Synopsis: Referred in April 2022 for COPD by Noreene Larsson, NP  Subjective:   PATIENT ID: [name] GENDER: female DOB: [birth year], MRN: [id number]  Chief Complaint  Patient presents with   Follow-up    Patient says everything is going good. She is still congested.     PMH of asthma and copd, HTN, DVT (2011 and 2014), currently not on a blood thinner, history of sepsis and empyema. She was admitted to the hospital and was on ventilator for a time being. She moved to [place] and followed with Dr. Koleen Nimrod in [place]. Prior to that she saw Dr. Lamonte Sakai. COPD is currently management with spiriva and advair.  Additionally she is taking theophylline.  She does not take this every day.  She is on 400 mg daily.  She also is still smoking a half a pack a day.  She was able to quit smoking when she was 51 years old for a year.  But otherwise has smoked for the past 35 years.  At her max was 2 packs/day and currently down to half a pack a day.  She has not had lung cancer screening.  We talked about this today in the office.   OV 10/22/2020: recently admitted to [facility], admitted on Monday and discharged on last Thursday.  She initially left AMA and had to go back to the hospital.  Treated with antibiotics and steroids.  Overall she is feeling better.  Still maintained on Spiriva and Advair.  She is really trying to quit smoking.  She is using gum as well as nicotine patches.  She is only using her oxygen "as needed".  Unfortunately she is hypoxemic today in the office with O2 sat of 84%.  She does need orders again for her oxygen supply at home.  OV 05/10/2021: Here today for follow-up after recent discharge from [facility].  Unfortunately she was readmitted for a COPD exacerbation.  She states that she was restarted on all of her home meds to include theophylline.  She had not been taking that for several months but was taking it in the hospital.  Currently still smoking able to cut down  to few cigarettes per day.  Using her Advair plus Spiriva as that was the most cost effective option.  From a respiratory standpoint she is doing okay and improving since her hospitalization.  She was seeing Dr. Koleen Nimrod in [place] who is now retired.  She did have her lung cancer screening CT done in September of this past year.   Past Medical History:  Diagnosis Date   ASTHMA 02/11/2008   Qualifier: Diagnosis of  By: Wynetta Emery RN, Erika     Cholelithiasis    COPD (chronic obstructive pulmonary disease) ([place])    DVT 12/18/2008   Qualifier: Diagnosis of  By: Lamonte Sakai MD, Rose Fillers    DVT (deep venous thrombosis) ([place])    once occurred after trauma,completed coumadin 05/2009   Empyema    GERD (gastroesophageal reflux disease)    Hypertension    Insomnia    Lung abscess ([place]) 2009   METHICILLIN SUSECPTIBLE PNEUMONIA STAPH AUREUS 02/11/2008   Qualifier: Diagnosis of  By: Wynetta Emery RN, Erika     MSSA (methicillin susceptible Staphylococcus aureus) pneumonia ([place])    Pulmonary nodules    Rectal bleeding 05/16/2011     Family History  Problem Relation Age of Onset   Heart disease Mother    Diabetes Mother    Colon cancer Father  Throat cancer Father    Breast cancer Sister      Past Surgical History:  Procedure Laterality Date   BREAST BIOPSY  2014   left breast lumpectomy for "cancer cells"   CESAREAN SECTION  2005   x2   Chest tube placement  2009    Social History   Socioeconomic History   Marital status: Single    Spouse name: Not on file   Number of children: 6   Years of education: Not on file   Highest education level: Not on file  Occupational History   Occupation: homemaker  Tobacco Use   Smoking status: Every Day    Packs/day: 0.50    Years: 26.00    Pack years: 13.00    Types: Cigarettes    Start date: 84   Smokeless tobacco: Never   Tobacco comments:    1 ciggs a day. Started at age 51.  Vaping Use   Vaping Use: Never used  Substance and Sexual  Activity   Alcohol use: Yes    Comment: mixed drink 1-2 times/month   Drug use: No   Sexual activity: Not Currently    Birth control/protection: None  Other Topics Concern   Not on file  Social History Narrative   Not on file   Social Determinants of Health   Financial Resource Strain: Not on file  Food Insecurity: Not on file  Transportation Needs: Not on file  Physical Activity: Not on file  Stress: Not on file  Social Connections: Not on file  Intimate Partner Violence: Not on file     Allergies  Allergen Reactions   Cefaclor    Penicillins      Outpatient Medications Prior to Visit  Medication Sig Dispense Refill   albuterol (VENTOLIN HFA) 108 (90 Base) MCG/ACT inhaler Inhale 2 puffs into the lungs every 6 (six) hours as needed for wheezing or shortness of breath. 8 g 6   ALPRAZolam (XANAX) 0.25 MG tablet Take 1 tablet (0.25 mg total) by mouth 2 (two) times daily as needed for anxiety. 10 tablet 0   benzonatate (TESSALON) 100 MG capsule Take 200 mg by mouth 3 (three) times daily as needed.     busPIRone (BUSPAR) 5 MG tablet Take 1 tablet (5 mg total) by mouth 2 (two) times daily. 60 tablet 1   ipratropium-albuterol (DUONEB) 0.5-2.5 (3) MG/3ML SOLN Take 3 mLs by nebulization every 4 (four) hours as needed. 360 mL 1   metoprolol succinate (TOPROL-XL) 25 MG 24 hr tablet Take 25 mg by mouth 2 (two) times daily.     montelukast (SINGULAIR) 10 MG tablet Take 1 tablet (10 mg total) by mouth at bedtime. 30 tablet 11   nicotine (NICODERM CQ - DOSED IN MG/24 HOURS) 21 mg/24hr patch Place 1 patch (21 mg total) onto the skin daily as needed (nicotine craving). 28 patch 0   omeprazole (PRILOSEC) 40 MG capsule Take 40 mg by mouth daily.     OXYGEN Inhale into the lungs. 4 liters at bedtime     tiotropium (SPIRIVA) 18 MCG inhalation capsule Place 1 capsule (18 mcg total) into inhaler and inhale daily. 30 capsule 6   VENTOLIN HFA 108 (90 BASE) MCG/ACT inhaler INHALE 2 PUFFS INTO LUNGS  EVERY 4 HOURS AS NEEDED FOR WHEEZING (Patient taking differently: Inhale 2 puffs into the lungs every 4 (four) hours as needed for shortness of breath.) 18 each 3   zolpidem (AMBIEN) 5 MG tablet Take 1 tablet (5 mg total)  by mouth at bedtime as needed for sleep. 30 tablet 2   doxycycline (VIBRA-TABS) 100 MG tablet Take 1 tablet (100 mg total) by mouth every 12 (twelve) hours. (Patient not taking: Reported on 05/10/2021) 8 tablet 0   predniSONE (DELTASONE) 50 MG tablet Take 1 tablet (50 mg total) by mouth daily. (Patient not taking: Reported on 05/10/2021) 5 tablet 0   theophylline (THEO-24) 400 MG 24 hr capsule Take 400 mg by mouth daily. (Patient not taking: Reported on 05/10/2021)     No facility-administered medications prior to visit.    Review of Systems  Constitutional:  Negative for chills, fever, malaise/fatigue and weight loss.  HENT:  Negative for hearing loss, sore throat and tinnitus.   Eyes:  Negative for blurred vision and double vision.  Respiratory:  Positive for cough and shortness of breath. Negative for hemoptysis, sputum production, wheezing and stridor.   Cardiovascular:  Negative for chest pain, palpitations, orthopnea, leg swelling and PND.  Gastrointestinal:  Negative for abdominal pain, constipation, diarrhea, heartburn, nausea and vomiting.  Genitourinary:  Negative for dysuria, hematuria and urgency.  Musculoskeletal:  Negative for joint pain and myalgias.  Skin:  Negative for itching and rash.  Neurological:  Negative for dizziness, tingling, weakness and headaches.  Endo/Heme/Allergies:  Negative for environmental allergies. Does not bruise/bleed easily.  Psychiatric/Behavioral:  Negative for depression. The patient is not nervous/anxious and does not have insomnia.   All other systems reviewed and are negative.   Objective:  Physical Exam Vitals reviewed.  Constitutional:      General: She is not in acute distress.    Appearance: She is well-developed.      Comments: Thin low muscle mass  HENT:     Head: Normocephalic and atraumatic.     Mouth/Throat:     Pharynx: No oropharyngeal exudate.  Eyes:     Conjunctiva/sclera: Conjunctivae normal.     Pupils: Pupils are equal, round, and reactive to light.  Neck:     Vascular: No JVD.     Trachea: No tracheal deviation.     Comments: Loss of supraclavicular fat Cardiovascular:     Rate and Rhythm: Normal rate and regular rhythm.     Heart sounds: S1 normal and S2 normal.     Comments: Distant heart tones Pulmonary:     Effort: No tachypnea or accessory muscle usage.     Breath sounds: No stridor. Decreased breath sounds (throughout all lung fields) present. No wheezing, rhonchi or rales.     Comments: Diminished breath sounds bilaterally Abdominal:     General: Bowel sounds are normal. There is no distension.     Palpations: Abdomen is soft.     Tenderness: There is no abdominal tenderness.  Musculoskeletal:        General: Deformity (muscle wasting ) present.  Skin:    General: Skin is warm and dry.     Capillary Refill: Capillary refill takes less than 2 seconds.     Findings: No rash.  Neurological:     Mental Status: She is alert and oriented to person, place, and time.  Psychiatric:        Behavior: Behavior normal.     Vitals:   05/10/21 1444  BP: (!) 116/56  Pulse: (!) 109  Temp: 98.1 F (36.7 C)  TempSrc: Oral  SpO2: 93%  Weight: 130 lb 12.8 oz (59.3 kg)  Height: 5\' 3"  (1.6 m)   93% on RA BMI Readings from Last 3 Encounters:  05/10/21 23.17  kg/m  04/28/21 21.79 kg/m  10/22/20 22.96 kg/m   Wt Readings from Last 3 Encounters:  05/10/21 130 lb 12.8 oz (59.3 kg)  04/28/21 123 lb 0.3 oz (55.8 kg)  10/22/20 138 lb (62.6 kg)    CBC    Component Value Date/Time   WBC 7.3 04/28/2021 0401   RBC 4.50 04/28/2021 0401   HGB 14.7 04/28/2021 0401   HGB 16.7 (H) 06/29/2020 0959   HCT 47.9 (H) 04/28/2021 0401   HCT 51.6 (H) 06/29/2020 0959   PLT 203 04/28/2021  0401   PLT 260 06/29/2020 0959   MCV 106.4 (H) 04/28/2021 0401   MCV 94 06/29/2020 0959   MCH 32.7 04/28/2021 0401   MCHC 30.7 04/28/2021 0401   RDW 14.9 04/28/2021 0401   RDW 14.5 06/29/2020 0959   LYMPHSABS 0.8 04/28/2021 0401   LYMPHSABS 1.7 06/29/2020 0959   MONOABS 0.5 04/28/2021 0401   EOSABS 0.0 04/28/2021 0401   EOSABS 0.2 06/29/2020 0959   BASOSABS 0.0 04/28/2021 0401   BASOSABS 0.0 06/29/2020 0959    Chest Imaging: No recent CXR   Pulmonary Functions Testing Results: No flowsheet data found.  FeNO:   Pathology:   Echocardiogram:   Heart Catheterization:     Assessment & Plan:     ICD-10-CM   1. Chronic obstructive pulmonary disease, unspecified COPD type ([place])  J44.9     2. Tobacco abuse  Z72.0     3. Moderate cigarette smoker (10-19 per day)  F17.210     4. Current smoker  F17.200     5. Encounter for smoking cessation counseling  Z71.6       Discussion:  51 year old female, current smoker, likely has COPD.  Currently on Advair and Spiriva.  Has been on theophylline in the past as well as azithromycin Monday Wednesday Friday.  She unfortunately is still smoking even despite her recent hospitalization for COPD exacerbation.  She also showed acute retention of CO2.  Plan: She needs to stay on triple therapy inhaler She needs to stop smoking.  She was counseled heavily on smoking cessation today in the office. I think this is her most important next step in the management of her COPD.  I do not see how were going to prevent recurrent exacerbations and her if she continues to smoke despite triple therapy inhaler regimen. I will start her on azithromycin Monday Wednesday and Friday again. New prescription for this given. We will have her return to clinic in approximately 4 weeks to ensure smoking cessation and that then check her QTC with a repeat EKG. Orders have been placed.    Current Outpatient Medications:    albuterol (VENTOLIN HFA) 108 (90  Base) MCG/ACT inhaler, Inhale 2 puffs into the lungs every 6 (six) hours as needed for wheezing or shortness of breath., Disp: 8 g, Rfl: 6   ALPRAZolam (XANAX) 0.25 MG tablet, Take 1 tablet (0.25 mg total) by mouth 2 (two) times daily as needed for anxiety., Disp: 10 tablet, Rfl: 0   azithromycin (ZITHROMAX) 250 MG tablet, Take 1 tablet (250 mg total) by mouth every Monday, Wednesday, and Friday., Disp: 36 tablet, Rfl: 2   benzonatate (TESSALON) 100 MG capsule, Take 200 mg by mouth 3 (three) times daily as needed., Disp: , Rfl:    busPIRone (BUSPAR) 5 MG tablet, Take 1 tablet (5 mg total) by mouth 2 (two) times daily., Disp: 60 tablet, Rfl: 1   ipratropium-albuterol (DUONEB) 0.5-2.5 (3) MG/3ML SOLN, Take 3 mLs by nebulization  every 4 (four) hours as needed., Disp: 360 mL, Rfl: 1   metoprolol succinate (TOPROL-XL) 25 MG 24 hr tablet, Take 25 mg by mouth 2 (two) times daily., Disp: , Rfl:    montelukast (SINGULAIR) 10 MG tablet, Take 1 tablet (10 mg total) by mouth at bedtime., Disp: 30 tablet, Rfl: 11   nicotine (NICODERM CQ - DOSED IN MG/24 HOURS) 21 mg/24hr patch, Place 1 patch (21 mg total) onto the skin daily as needed (nicotine craving)., Disp: 28 patch, Rfl: 0   omeprazole (PRILOSEC) 40 MG capsule, Take 40 mg by mouth daily., Disp: , Rfl:    OXYGEN, Inhale into the lungs. 4 liters at bedtime, Disp: , Rfl:    tiotropium (SPIRIVA) 18 MCG inhalation capsule, Place 1 capsule (18 mcg total) into inhaler and inhale daily., Disp: 30 capsule, Rfl: 6   VENTOLIN HFA 108 (90 BASE) MCG/ACT inhaler, INHALE 2 PUFFS INTO LUNGS EVERY 4 HOURS AS NEEDED FOR WHEEZING (Patient taking differently: Inhale 2 puffs into the lungs every 4 (four) hours as needed for shortness of breath.), Disp: 18 each, Rfl: 3   doxycycline (VIBRA-TABS) 100 MG tablet, Take 1 tablet (100 mg total) by mouth every 12 (twelve) hours. (Patient not taking: Reported on 05/10/2021), Disp: 8 tablet, Rfl: 0   predniSONE (DELTASONE) 50 MG tablet, Take  1 tablet (50 mg total) by mouth daily. (Patient not taking: Reported on 05/10/2021), Disp: 5 tablet, Rfl: 0   Garner Nash, DO [facility] 05/10/2021 2:56 PM

## 2021-05-10 NOTE — Patient Instructions (Addendum)
Thank you for visiting Dr. Valeta Harms at [facility]. Today we recommend the following:  Orders Placed This Encounter  Procedures   EKG 12-Lead   Meds ordered this encounter  Medications   azithromycin (ZITHROMAX) 250 MG tablet    Sig: Take 1 tablet (250 mg total) by mouth every Monday, Wednesday, and Friday.    Dispense:  36 tablet    Refill:  2    Return in about 4 weeks (around 06/07/2021) for with APP. Repeat ECG at next office visit     Please do your part to reduce the spread of COVID-19.

## 2021-05-31 ENCOUNTER — Telehealth: Payer: Self-pay | Admitting: Pulmonary Disease

## 2021-05-31 DIAGNOSIS — J449 Chronic obstructive pulmonary disease, unspecified: Secondary | ICD-10-CM

## 2021-05-31 MED ORDER — TIOTROPIUM BROMIDE MONOHYDRATE 18 MCG IN CAPS: 18.0000 ug | ORAL_CAPSULE | Freq: Every day | RESPIRATORY_TRACT | 6 refills | Status: DC

## 2021-05-31 MED ORDER — FLUTICASONE-SALMETEROL 250-50 MCG/ACT IN AEPB: 1.0000 | INHALATION_SPRAY | Freq: Two times a day (BID) | RESPIRATORY_TRACT | 11 refills | Status: DC

## 2021-05-31 MED ORDER — ALBUTEROL SULFATE HFA 108 (90 BASE) MCG/ACT IN AERS: 2.0000 | INHALATION_SPRAY | RESPIRATORY_TRACT | 3 refills | Status: DC | PRN

## 2021-05-31 MED ORDER — MONTELUKAST SODIUM 10 MG PO TABS: 10.0000 mg | ORAL_TABLET | Freq: Every day | ORAL | 11 refills | Status: DC

## 2021-05-31 MED ORDER — IPRATROPIUM-ALBUTEROL 0.5-2.5 (3) MG/3ML IN SOLN: 3.0000 mL | RESPIRATORY_TRACT | 5 refills | Status: AC | PRN

## 2021-05-31 NOTE — Telephone Encounter (Signed)
Called and spoke with pt to get meds that she needed to have refilled and have sent all to preferred pharmacy for pt. Nothing further needed.

## 2021-06-08 ENCOUNTER — Ambulatory Visit: Payer: Medicaid Other | Admitting: Primary Care

## 2021-12-28 ENCOUNTER — Ambulatory Visit (HOSPITAL_COMMUNITY): Admission: RE | Admit: 2021-12-28 | Payer: Medicaid Other | Source: Ambulatory Visit

## 2021-12-28 ENCOUNTER — Encounter (HOSPITAL_COMMUNITY): Payer: Self-pay

## 2022-02-17 ENCOUNTER — Other Ambulatory Visit: Payer: Self-pay | Admitting: *Deleted

## 2022-02-17 MED ORDER — TIOTROPIUM BROMIDE MONOHYDRATE 18 MCG IN CAPS: 18.0000 ug | ORAL_CAPSULE | Freq: Every day | RESPIRATORY_TRACT | 0 refills | Status: DC

## 2022-03-07 ENCOUNTER — Encounter: Payer: Self-pay | Admitting: *Deleted

## 2022-03-16 ENCOUNTER — Telehealth: Payer: Self-pay | Admitting: *Deleted

## 2022-03-16 NOTE — Telephone Encounter (Signed)
Received a voicemail from pt to schedule lung screening CT. Returned call to pt and left voicemail for pt to call back to schedule CT.

## 2022-04-23 ENCOUNTER — Other Ambulatory Visit: Payer: Self-pay | Admitting: Pulmonary Disease

## 2022-05-19 ENCOUNTER — Other Ambulatory Visit: Payer: Self-pay | Admitting: *Deleted

## 2022-05-19 MED ORDER — TIOTROPIUM BROMIDE MONOHYDRATE 18 MCG IN CAPS: 18.0000 ug | ORAL_CAPSULE | Freq: Every day | RESPIRATORY_TRACT | 1 refills | Status: AC

## 2022-05-19 MED ORDER — ALBUTEROL SULFATE HFA 108 (90 BASE) MCG/ACT IN AERS: 2.0000 | INHALATION_SPRAY | RESPIRATORY_TRACT | 3 refills | Status: AC | PRN

## 2022-06-06 ENCOUNTER — Ambulatory Visit: Payer: Medicaid Other | Admitting: Pulmonary Disease

## 2022-06-06 ENCOUNTER — Telehealth: Payer: Self-pay | Admitting: Pulmonary Disease

## 2022-06-06 ENCOUNTER — Encounter: Payer: Self-pay | Admitting: *Deleted

## 2022-06-06 NOTE — Telephone Encounter (Signed)
Attempted to call pt but unable to reach. Left message to return call.  

## 2022-06-06 NOTE — Telephone Encounter (Signed)
Pt was scheduled for ov with Dr Valeta Harms 06/06/22 at 11:30 am Unfortunately, the pt was a no show  This makes the third time she has no showed for appt with one of our providers here  I tried making contact with this pt via telelphone- called her work number and her mobile number  Work numbers rings but there is no answer and no option to leave a [employer] number rings and then turns to a busy tone   Dr Valeta Harms- would you like to proceed with discharging this pt from our practice due to frequent no shows?

## 2022-06-06 NOTE — Telephone Encounter (Signed)
Letter sent to the pt via mychart

## 2023-05-02 ENCOUNTER — Encounter: Payer: Self-pay | Admitting: Acute Care

## 2023-06-09 ENCOUNTER — Encounter: Payer: Self-pay | Admitting: Nurse Practitioner

## 2023-06-09 ENCOUNTER — Other Ambulatory Visit: Payer: Self-pay | Admitting: Nurse Practitioner

## 2023-06-09 DIAGNOSIS — R9389 Abnormal findings on diagnostic imaging of other specified body structures: Secondary | ICD-10-CM

## 2023-06-21 ENCOUNTER — Ambulatory Visit
Admission: RE | Admit: 2023-06-21 | Discharge: 2023-06-21 | Disposition: A | Discharge status: Home / Self Care | Payer: 59 | Source: Ambulatory Visit | Attending: Nurse Practitioner | Admitting: Nurse Practitioner

## 2023-06-21 DIAGNOSIS — R9389 Abnormal findings on diagnostic imaging of other specified body structures: Secondary | ICD-10-CM

## 2023-08-16 ENCOUNTER — Other Ambulatory Visit: Payer: Self-pay | Admitting: Nurse Practitioner

## 2023-08-16 DIAGNOSIS — F17210 Nicotine dependence, cigarettes, uncomplicated: Secondary | ICD-10-CM

## 2023-08-16 DIAGNOSIS — Z136 Encounter for screening for cardiovascular disorders: Secondary | ICD-10-CM

## 2023-08-18 ENCOUNTER — Other Ambulatory Visit: Payer: Self-pay | Admitting: Nurse Practitioner

## 2023-08-21 ENCOUNTER — Other Ambulatory Visit

## 2023-08-24 ENCOUNTER — Ambulatory Visit
Admission: RE | Admit: 2023-08-24 | Discharge: 2023-08-24 | Disposition: A | Discharge status: Home / Self Care | Source: Ambulatory Visit | Attending: Nurse Practitioner | Admitting: Nurse Practitioner

## 2023-08-24 DIAGNOSIS — Z136 Encounter for screening for cardiovascular disorders: Secondary | ICD-10-CM

## 2023-08-31 ENCOUNTER — Encounter: Payer: Self-pay | Admitting: Nurse Practitioner

## 2023-09-01 ENCOUNTER — Inpatient Hospital Stay: Admission: RE | Admit: 2023-09-01 | Source: Ambulatory Visit

## 2023-09-12 ENCOUNTER — Encounter (INDEPENDENT_AMBULATORY_CARE_PROVIDER_SITE_OTHER): Payer: Self-pay | Admitting: *Deleted

## 2023-09-19 ENCOUNTER — Encounter (INDEPENDENT_AMBULATORY_CARE_PROVIDER_SITE_OTHER): Payer: Self-pay | Admitting: Gastroenterology

## 2023-09-19 ENCOUNTER — Encounter (INDEPENDENT_AMBULATORY_CARE_PROVIDER_SITE_OTHER): Payer: Self-pay | Admitting: *Deleted

## 2023-09-19 ENCOUNTER — Ambulatory Visit (INDEPENDENT_AMBULATORY_CARE_PROVIDER_SITE_OTHER): Admitting: Gastroenterology

## 2023-09-19 VITALS — BP 105/61 | HR 106 | Temp 98.4°F | Ht 64.0 in | Wt 149.7 lb

## 2023-09-19 DIAGNOSIS — R1011 Right upper quadrant pain: Secondary | ICD-10-CM

## 2023-09-19 DIAGNOSIS — Z1211 Encounter for screening for malignant neoplasm of colon: Secondary | ICD-10-CM | POA: Insufficient documentation

## 2023-09-19 DIAGNOSIS — R1013 Epigastric pain: Secondary | ICD-10-CM | POA: Insufficient documentation

## 2023-09-19 DIAGNOSIS — K8010 Calculus of gallbladder with chronic cholecystitis without obstruction: Secondary | ICD-10-CM | POA: Diagnosis not present

## 2023-09-19 NOTE — Patient Instructions (Addendum)
 It was very nice to meet you today, as dicussed with will plan for the following :  1) Ultrasound Abdomen   2)Omeprazole 40mg    3) Gas-X and BEANO  4) Talk to your PCP about cologuard /Fit test   5) Stop using high dose aspirin including Goody/BC powders, NSAIDs such as Aleve, ibuprofen, naproxen, Motrin, Voltaren or Advil (even the topical ones)

## 2023-09-19 NOTE — Progress Notes (Signed)
 Aayush Gelpi Faizan Lavetta Geier , M.D. Gastroenterology & Hepatology [facility]enter[facility] [address] [place], [place] [address] Primary Care Physician: Caresse Chant, FNP [address] [place] [address]  Chief Complaint: Abdominal pain, bloating, colon cancer screening  History of Present Illness: [name] is a 53 y.o. female with chronic respiratory failure on 6 Liter oxygen , COPD who presents for evaluation of Abdominal pain, bloating, colon cancer screening  Patient reports abdominal pain which has been present for at least 3 years.  Patient reports she was told it was her gallbladder and needed to be removed because of COVID-19 everything was deferred  Patient reports postprandial epigastric pain associated with nausea.  She does take ibuprofen intermittently.  Has been taking omeprazole without much relief.  Reports bloating at the end of the day.The patient denies having any  fever, chills, hematochezia, melena, hematemesis,  diarrhea, jaundice, pruritus or weight loss.  Last WGN:FAOZ Last Colonoscopy:7 years ago at [facility] and was told she had polyps   FHx: neg for any gastrointestinal/liver disease, no malignancies Social: current smoker Surgical: no abdominal surgeries  07/12/19 by PCP, done at [facility]: Cholelithiasis but no findings of acute cholecystitis. Normal CBD diameter of 5mm. Liver unremarkable.   3/21: H.pylori stool Ag negative.  Last TTE 01/2023 with EF 60-65 mild MR PA systolic pressure 43  Past Medical History: Past Medical History:  Diagnosis Date   ASTHMA 02/11/2008   Qualifier: Diagnosis of  By: Lincoln Renshaw RN, Erika     Cholelithiasis    COPD (chronic obstructive pulmonary disease) (HCC)    DVT 12/18/2008   Qualifier: Diagnosis of  By: Baldwin Levee MD, [place]    DVT (deep venous thrombosis) (HCC)    once occurred after trauma,completed coumadin 05/2009   Empyema    GERD (gastroesophageal reflux disease)     Hypertension    Insomnia    Lung abscess (HCC) 2009   METHICILLIN SUSECPTIBLE PNEUMONIA STAPH AUREUS 02/11/2008   Qualifier: Diagnosis of  By: Lincoln Renshaw RN, Erika     MSSA (methicillin susceptible Staphylococcus aureus) pneumonia (HCC)    Pulmonary nodules    Rectal bleeding 05/16/2011    Past Surgical History: Past Surgical History:  Procedure Laterality Date   BREAST BIOPSY  2014   left breast lumpectomy for "cancer cells"   CESAREAN SECTION  2005   x2   Chest tube placement  2009    Family History: Family History  Problem Relation Age of Onset   Heart disease Mother    Diabetes Mother    Colon cancer Father    Throat cancer Father    Breast cancer Sister     Social History: Social History   Tobacco Use  Smoking Status Every Day   Current packs/day: 0.50   Average packs/day: 0.5 packs/day for 39.4 years (19.7 ttl pk-yrs)   Types: Cigarettes   Start date: 1986  Smokeless Tobacco Never  Tobacco Comments   1 ciggs a day. Started at age 24.   Social History   Substance and Sexual Activity  Alcohol Use Yes   Comment: mixed drink 1-2 times/month   Social History   Substance and Sexual Activity  Drug Use No    Allergies: Allergies  Allergen Reactions   Cefaclor    Penicillins     Medications: Current Outpatient Medications  Medication Sig Dispense Refill   albuterol  (VENTOLIN  HFA) 108 (90 Base) MCG/ACT inhaler Inhale 2 puffs into the lungs every 4 (four)  hours as needed for wheezing or shortness of breath. INHALE 2 PUFFS INTO LUNGS EVERY 4 HOURS AS NEEDED FOR WHEEZING Strength: 108 (90 Base) MCG/ACT 18 each 3   budesonide  (PULMICORT ) 0.5 MG/2ML nebulizer solution Take 0.5 mg by nebulization 2 (two) times daily.     busPIRone  (BUSPAR ) 5 MG tablet Take 1 tablet (5 mg total) by mouth 2 (two) times daily. 60 tablet 1   Cholecalciferol 50 MCG (2000 UT) CAPS Take 1 capsule by mouth daily.     formoterol (PERFOROMIST) 20 MCG/2ML nebulizer solution Take 20 mcg by  nebulization 2 (two) times daily.     ipratropium-albuterol  (DUONEB) 0.5-2.5 (3) MG/3ML SOLN Take 3 mLs by nebulization every 4 (four) hours as needed. 360 mL 5   metoprolol  succinate (TOPROL -XL) 25 MG 24 hr tablet Take 25 mg by mouth 2 (two) times daily.     montelukast  (SINGULAIR ) 10 MG tablet Take 10 mg by mouth at bedtime.     omeprazole (PRILOSEC) 40 MG capsule Take 40 mg by mouth daily.     OXYGEN Inhale into the lungs. 4 liters at bedtime     predniSONE  (DELTASONE ) 50 MG tablet Take 1 tablet (50 mg total) by mouth daily. 5 tablet 0   tiotropium (SPIRIVA ) 18 MCG inhalation capsule Place 1 capsule (18 mcg total) into inhaler and inhale daily. 30 capsule 1   No current facility-administered medications for this visit.    Review of Systems: GENERAL: negative for malaise, night sweats HEENT: No changes in hearing or vision, no nose bleeds or other nasal problems. NECK: Negative for lumps, goiter, pain and significant neck swelling RESPIRATORY: Negative for cough, wheezing CARDIOVASCULAR: Negative for chest pain, leg swelling, palpitations, orthopnea GI: SEE HPI MUSCULOSKELETAL: Negative for joint pain or swelling, back pain, and muscle pain. SKIN: Negative for lesions, rash HEMATOLOGY Negative for prolonged bleeding, bruising easily, and swollen nodes. ENDOCRINE: Negative for cold or heat intolerance, polyuria, polydipsia and goiter. NEURO: negative for tremor, gait imbalance, syncope and seizures. The remainder of the review of systems is noncontributory.   Physical Exam: BP 105/61 (BP Location: Left Arm, Patient Position: Sitting, Cuff Size: Normal)   Pulse (!) 106   Temp 98.4 F (36.9 C) (Temporal)   Ht 5\' 4"  (1.626 m)   Wt 149 lb 11.2 oz (67.9 kg)   LMP 06/11/2019 (Approximate)   BMI 25.70 kg/m  GENERAL: The patient is AO x3, in no acute distress. HEENT: Head is normocephalic and atraumatic. EOMI are intact. Mouth is well hydrated and without lesions. NECK: Supple. No  masses LUNGS: Clear to auscultation. No presence of rhonchi/wheezing/rales. Adequate chest expansion HEART: RRR, normal s1 and s2. ABDOMEN: Soft, mild epigastric and right upper quadrant tenderness, no guarding, no peritoneal signs, and nondistended. BS +. No masses.  Imaging/Labs: as above     Latest Ref Rng & Units 04/28/2021    4:01 AM 04/27/2021   11:57 AM 07/28/2020   11:43 AM  CBC  WBC 4.0 - 10.5 K/uL 7.3  7.9  16.1   Hemoglobin 12.0 - 15.0 g/dL 09.8  11.9  14.7   Hematocrit 36.0 - 46.0 % 47.9  48.4  44.8   Platelets 150 - 400 K/uL 203  185  246    Lab Results  Component Value Date   IRON 80 06/29/2020   TIBC 366 06/29/2020   FERRITIN 48 06/29/2020    I personally reviewed and interpreted the available labs, imaging and endoscopic files.  Impression and Plan:  Kim Beattie  [name] is a 53 y.o. female with chronic respiratory failure on 6 Liter oxygen , COPD who presents for evaluation of Abdominal pain, bloating, colon cancer screening  #Abdominal pain  # Dyspepsia/bloating  Patient has epigastric and right upper quadrant pain which is mostly postprandial, with NSAID use could be peptic ulcer disease or gallbladder disease  Although ultrasound 2022 with cholelithiasis and colopathy biliary colic/gallbladder disease and was suggested cholecystectomy previously but was lost to follow-up because of COVID-19  I discussed with patient because of chronic postprandial pain and NSAID use not relieved with PPI, upper endoscopy is indicated to evaluate for any peptic ulcer disease or any other etiology.  I discussed with patient clearly that she is high risk for intraprocedural adverse effects due to chronic respiratory failure on 6 L oxygen. We will reach out to patient pulmonologist for restratification optimization before scheduling any procedure at this time.  Patient verbalizes understanding  -Repeat complete abdominal ultrasound -Advised NSAID cessation -PPI 30 minutes before  breakfast -BEANO/Gas-X -Surgical evaluation as patient reports she was previously scheduled for cholecystectomy and was lost to follow-up  #Colon cancer screening  The patient was counseled regarding the importance of colorectal cancer screening, . The benefits of screening include early detection of colorectal cancer and precancerous polyps, which can improve treatment outcomes and reduce mortality. Risks associated with screening, particularly colonoscopy, include potential complications such as bleeding and perforation. After deciding different modalities for screening for colon cancer , patient has opted to pursue Cologuard/Fit test given patient high risk of intraprocedural adverse effect. Last colonoscopy over 7 years ago    All questions were answered.      [name] Faizan Liann Spaeth, MD Gastroenterology and Hepatology [facility]   This chart has been completed using [facility] Dictation software, and while attempts have been made to ensure accuracy , certain words and phrases may not be transcribed as intended

## 2023-09-20 ENCOUNTER — Telehealth: Payer: Self-pay | Admitting: *Deleted

## 2023-09-20 NOTE — Telephone Encounter (Signed)
Faxed to pulmonary.

## 2023-09-20 NOTE — Telephone Encounter (Signed)
  Request for patient to stop medication prior to procedure or is needing cleareance  09/20/23  [name] 07/31/1970  What type of surgery is being performed? Colonoscopy/EGD  When is surgery scheduled? TBD  What type of clearance is required (medical or pharmacy to hold medication or both? medical  Patient is needing to have pulmonary clearance prior to being scheduled for procedure.  Name of physician performing surgery?  Dr.Ahmed Lannie Pizza Gastroenterology at [facility] Phone: [phone] Fax: [phone]  Anethesia type (none, local, MAC, general)? MAC

## 2023-09-29 ENCOUNTER — Ambulatory Visit (HOSPITAL_COMMUNITY)
Admission: RE | Admit: 2023-09-29 | Discharge: 2023-09-29 | Disposition: A | Discharge status: Home / Self Care | Source: Ambulatory Visit | Attending: Gastroenterology | Admitting: Gastroenterology

## 2023-09-29 DIAGNOSIS — R1013 Epigastric pain: Secondary | ICD-10-CM | POA: Insufficient documentation

## 2023-10-03 NOTE — Telephone Encounter (Signed)
 Spoke to pt and she states she no longer goes to [facility]. She sees Dr.Grossman at [facility] in [place]. Faxed clearance to Dr.Grossman.

## 2023-10-04 ENCOUNTER — Ambulatory Visit (INDEPENDENT_AMBULATORY_CARE_PROVIDER_SITE_OTHER): Payer: Self-pay | Admitting: Gastroenterology

## 2023-10-04 NOTE — Telephone Encounter (Signed)
 Thank you.  I reviewed the note scanned into media.  Unfortunately this is just clinic note from 07/2023.  I recommend patient following up with her pulmonologist as she has severe COPD for optimization and stratification for the procedure

## 2023-10-04 NOTE — Telephone Encounter (Signed)
 Pulmonary clearance scanned under media tab.

## 2023-10-05 NOTE — Telephone Encounter (Signed)
 Pt informed of providers message and recommendations. Verbalized understanding. She will call back once appt is set up

## 2023-10-17 ENCOUNTER — Ambulatory Visit (INDEPENDENT_AMBULATORY_CARE_PROVIDER_SITE_OTHER): Admitting: General Surgery

## 2023-10-17 ENCOUNTER — Encounter: Payer: Self-pay | Admitting: General Surgery

## 2023-10-17 VITALS — BP 99/57 | HR 102 | Temp 98.7°F | Resp 18 | Ht 64.0 in | Wt 155.0 lb

## 2023-10-17 DIAGNOSIS — K8020 Calculus of gallbladder without cholecystitis without obstruction: Secondary | ICD-10-CM

## 2023-10-18 NOTE — Progress Notes (Signed)
 [name]; [id number]; 10/02/1970   HPI Patient is a 53 year old white female who was referred to my care by Dr. Cinderella for evaluation and treatment of gallstones.  Patient has had a known history of gallstones for many years.  She currently complains of epigastric pain with substernal discomfort.  She occasionally gets bloating and nausea but no emesis.  She denies any fever, chills, or jaundice.  Her medical history is significant for COPD with a 6 L O2 requirement.  She states that she does have a frequent productive cough.  She has seen Dr. Cinderella of gastroenterology and it has been recommended that she undergo a colonoscopy and an EGD. Past Medical History:  Diagnosis Date   ASTHMA 02/11/2008   Qualifier: Diagnosis of  By: Vicci RN, Erika     Cholelithiasis    COPD (chronic obstructive pulmonary disease) (HCC)    DVT 12/18/2008   Qualifier: Diagnosis of  By: Shelah MD, Lamar RAMAN    DVT (deep venous thrombosis) (HCC)    once occurred after trauma,completed coumadin 05/2009   Empyema    GERD (gastroesophageal reflux disease)    Hypertension    Insomnia    Lung abscess (HCC) 2009   METHICILLIN SUSECPTIBLE PNEUMONIA STAPH AUREUS 02/11/2008   Qualifier: Diagnosis of  By: Vicci RN, Erika     MSSA (methicillin susceptible Staphylococcus aureus) pneumonia (HCC)    Pulmonary nodules    Rectal bleeding 05/16/2011    Past Surgical History:  Procedure Laterality Date   BREAST BIOPSY  2014   left breast lumpectomy for cancer cells   CESAREAN SECTION  2005   x2   Chest tube placement  2009    Family History  Problem Relation Age of Onset   Heart disease Mother    Diabetes Mother    Colon cancer Father    Throat cancer Father    Breast cancer Sister     Current Outpatient Medications on File Prior to Visit  Medication Sig Dispense Refill   albuterol  (VENTOLIN  HFA) 108 (90 Base) MCG/ACT inhaler Inhale 2 puffs into the lungs every 4 (four) hours as needed for wheezing or shortness  of breath. INHALE 2 PUFFS INTO LUNGS EVERY 4 HOURS AS NEEDED FOR WHEEZING Strength: 108 (90 Base) MCG/ACT 18 each 3   budesonide  (PULMICORT ) 0.5 MG/2ML nebulizer solution Take 0.5 mg by nebulization 2 (two) times daily.     busPIRone  (BUSPAR ) 5 MG tablet Take 1 tablet (5 mg total) by mouth 2 (two) times daily. 60 tablet 1   Cholecalciferol 50 MCG (2000 UT) CAPS Take 1 capsule by mouth daily.     formoterol (PERFOROMIST) 20 MCG/2ML nebulizer solution Take 20 mcg by nebulization 2 (two) times daily.     ipratropium-albuterol  (DUONEB) 0.5-2.5 (3) MG/3ML SOLN Take 3 mLs by nebulization every 4 (four) hours as needed. 360 mL 5   metoprolol  succinate (TOPROL -XL) 25 MG 24 hr tablet Take 25 mg by mouth 2 (two) times daily.     montelukast  (SINGULAIR ) 10 MG tablet Take 10 mg by mouth at bedtime.     omeprazole (PRILOSEC) 40 MG capsule Take 40 mg by mouth daily.     OXYGEN Inhale into the lungs. 4 liters at bedtime     predniSONE  (DELTASONE ) 50 MG tablet Take 1 tablet (50 mg total) by mouth daily. 5 tablet 0   tiotropium (SPIRIVA ) 18 MCG inhalation capsule Place 1 capsule (18 mcg total) into inhaler and inhale daily. 30 capsule 1   No current  facility-administered medications on file prior to visit.    Allergies  Allergen Reactions   Cefaclor    Penicillins     Social History   Substance and Sexual Activity  Alcohol Use Yes   Comment: mixed drink 1-2 times/month    Social History   Tobacco Use  Smoking Status Every Day   Current packs/day: 0.50   Average packs/day: 0.5 packs/day for 39.5 years (19.7 ttl pk-yrs)   Types: Cigarettes   Start date: 1986  Smokeless Tobacco Never  Tobacco Comments   1 ciggs a day. Started at age 32.    Review of Systems  Constitutional:  Positive for malaise/fatigue.  HENT: Negative.    Eyes: Negative.   Respiratory:  Positive for cough, shortness of breath and wheezing.   Cardiovascular: Negative.   Gastrointestinal:  Positive for abdominal pain and  heartburn.  Genitourinary: Negative.   Musculoskeletal:  Positive for back pain.  Skin: Negative.   Neurological:  Positive for dizziness.  Endo/Heme/Allergies: Negative.   Psychiatric/Behavioral: Negative.      Objective   Vitals:   10/17/23 1536  BP: (!) 99/57  Pulse: (!) 102  Resp: 18  Temp: 98.7 F (37.1 C)  SpO2: 90%    Physical Exam Vitals reviewed.  Constitutional:      Appearance: Normal appearance. She is normal weight. She is not ill-appearing.  HENT:     Head: Normocephalic and atraumatic.  Eyes:     General: No scleral icterus. Cardiovascular:     Rate and Rhythm: Normal rate and regular rhythm.     Heart sounds: Normal heart sounds. No murmur heard.    No friction rub. No gallop.  Pulmonary:     Effort: No respiratory distress.     Breath sounds: No stridor. Wheezing present. No rhonchi or rales.     Comments: Distant breath sounds noted Abdominal:     General: Bowel sounds are normal. There is no distension.     Palpations: Abdomen is soft. There is no mass.     Tenderness: There is no abdominal tenderness. There is no guarding or rebound.     Hernia: No hernia is present.  Skin:    General: Skin is warm and dry.  Neurological:     Mental Status: She is alert and oriented to person, place, and time.   Pulmonary notes reviewed GI notes reviewed  Assessment  Cholelithiasis with atypical symptoms of biliary colic.  She also has severe COPD which makes any surgical intervention at high risk as she would need general endotracheal intubation during the procedure.  My concern is that she may have a pulmonary complication after surgery requiring prolonged intubation. Plan  I told her at this point that she would need to proceed with a colonoscopy and EGD and see if this could be treated medically.  She has been on Prilosec in the past and has not been helpful.  Should she develop acute cholecystitis, a temporary cholecystostomy tube may be needed if her  pulmonary condition is unstable.  She fully understands this and agrees that she does not want surgical intervention unless absolutely necessary.

## 2023-11-21 NOTE — Progress Notes (Signed)
 This encounter was created in error - please disregard.

## 2023-12-11 ENCOUNTER — Ambulatory Visit (INDEPENDENT_AMBULATORY_CARE_PROVIDER_SITE_OTHER): Admitting: Gastroenterology

## 2023-12-12 ENCOUNTER — Encounter (INDEPENDENT_AMBULATORY_CARE_PROVIDER_SITE_OTHER): Payer: Self-pay | Admitting: Gastroenterology

## 2023-12-13 ENCOUNTER — Other Ambulatory Visit: Payer: Self-pay | Admitting: Nurse Practitioner

## 2023-12-13 DIAGNOSIS — R9389 Abnormal findings on diagnostic imaging of other specified body structures: Secondary | ICD-10-CM

## 2024-01-15 ENCOUNTER — Ambulatory Visit (INDEPENDENT_AMBULATORY_CARE_PROVIDER_SITE_OTHER): Admitting: Gastroenterology

## 2024-01-16 ENCOUNTER — Ambulatory Visit (INDEPENDENT_AMBULATORY_CARE_PROVIDER_SITE_OTHER): Admitting: Gastroenterology

## 2024-02-07 ENCOUNTER — Telehealth (INDEPENDENT_AMBULATORY_CARE_PROVIDER_SITE_OTHER): Payer: Self-pay | Admitting: Gastroenterology

## 2024-02-07 NOTE — Telephone Encounter (Signed)
 Pt left voicemail stating that she has been trying to get in touch with someone due to she believes her gallbladder is leaking or about to break. Returned call to pt. Pt states about 5 pm last night she began to have needle pain under left area of rib cage. 11 pm pain worsened. Pt states her arm also began to hurt. Pt reports pain from belly button toward left side. Did feel hot yesterday but took Tylenol . Has had some chills but not sure if it is from where its kind of chilly. Nauseated and bloated at times. Unable to eat. Advised pt to go to ER for further evaluation. Pt verbalized understanding.

## 2024-03-20 ENCOUNTER — Ambulatory Visit (INDEPENDENT_AMBULATORY_CARE_PROVIDER_SITE_OTHER): Admitting: Gastroenterology

## 2024-04-09 ENCOUNTER — Ambulatory Visit (INDEPENDENT_AMBULATORY_CARE_PROVIDER_SITE_OTHER): Admitting: Gastroenterology

## 2024-05-21 ENCOUNTER — Other Ambulatory Visit

## 2024-06-04 ENCOUNTER — Ambulatory Visit (INDEPENDENT_AMBULATORY_CARE_PROVIDER_SITE_OTHER): Admitting: Gastroenterology
